# Patient Record
Sex: Female | Born: 1965
Health system: Southern US, Community
[De-identification: ages and names within clinical notes are randomized; demographics above are authoritative.]

## PROBLEM LIST (undated history)

## (undated) DIAGNOSIS — N6310 Unspecified lump in the right breast, unspecified quadrant: Secondary | ICD-10-CM

## (undated) HISTORY — PX: DILATION AND CURETTAGE OF UTERUS: SHX78

---

## 2010-07-30 ENCOUNTER — Inpatient Hospital Stay (INDEPENDENT_AMBULATORY_CARE_PROVIDER_SITE_OTHER)
Admission: RE | Admit: 2010-07-30 | Discharge: 2010-07-30 | Disposition: A | Discharge status: Home / Self Care | Payer: BC Managed Care – PPO | Source: Ambulatory Visit | Attending: Family Medicine | Admitting: Family Medicine

## 2010-07-30 ENCOUNTER — Encounter: Payer: Self-pay | Admitting: Family Medicine

## 2010-07-30 DIAGNOSIS — M719 Bursopathy, unspecified: Secondary | ICD-10-CM

## 2010-07-30 DIAGNOSIS — R209 Unspecified disturbances of skin sensation: Secondary | ICD-10-CM

## 2010-07-30 DIAGNOSIS — M7710 Lateral epicondylitis, unspecified elbow: Secondary | ICD-10-CM

## 2010-07-30 DIAGNOSIS — M67919 Unspecified disorder of synovium and tendon, unspecified shoulder: Secondary | ICD-10-CM

## 2010-08-04 ENCOUNTER — Telehealth (INDEPENDENT_AMBULATORY_CARE_PROVIDER_SITE_OTHER): Payer: Self-pay | Admitting: *Deleted

## 2011-01-11 NOTE — Telephone Encounter (Signed)
  Phone Note Outgoing Call Call back at [facility] Phone [phone]   Call placed by: Lajean Saver RN,  August 04, 2010 3:18 PM Call placed to: Patient Action Taken: Phone Call Completed Summary of Call: Callback: Patient reports tingling resolved in one foot. Lab results given. TOld to f/u with neurologist if numbness persists. F/u with sports med for pain in elbow if needed.      Appended Document:  Discussed normal lab results.  She states that she still has slight tingling in one foot. Recommended that if symptoms persist, will refer to neurologist.

## 2011-01-11 NOTE — Progress Notes (Signed)
Summary: BOTH FEET AND RT ARM TINGLING Room 3   Vital Signs:  Patient Profile:   45 Years Old Female CC:      Tingling, numbness in rt arm x 1 month/Numbness in feet x 1 week today pain Height:     67 inches Weight:      145 pounds O2 Sat:      100 % O2 treatment:    Room Air Temp:     98.7 degrees F oral Pulse rate:   68 / minute Pulse rhythm:   regular Resp:     12 per minute BP sitting:   120 / 74  (left arm) Cuff size:   regular  Pt. in pain?   yes    Location:   foot    Intensity:   3    Type:       stinging  Vitals Entered By: Emilio Math (July 30, 2010 4:05 PM)                   Current Allergies: No known allergies History of Present Illness Chief Complaint: Tingling, numbness in rt arm x 1 month/Numbness in feet x 1 week today pain History of Present Illness:  Subjective:  Patient presents with two complaints: 1)  For about a month she has had intermittent numbness in the right shoulder and right elbow. 2)  For about 10 days she has tingling in her left foot, and in the right foot tingling in toes only.  No loss of strength  REVIEW OF SYSTEMS Constitutional Symptoms      Denies fever, chills, night sweats, weight loss, weight gain, and fatigue.  Eyes       Denies change in vision, eye pain, eye discharge, glasses, contact lenses, and eye surgery. Ear/Nose/Throat/Mouth       Denies hearing loss/aids, change in hearing, ear pain, ear discharge, dizziness, frequent runny nose, frequent nose bleeds, sinus problems, sore throat, hoarseness, and tooth pain or bleeding.  Respiratory       Denies dry cough, productive cough, wheezing, shortness of breath, asthma, bronchitis, and emphysema/COPD.  Cardiovascular       Denies murmurs, chest pain, and tires easily with exhertion.    Gastrointestinal       Denies stomach pain, nausea/vomiting, diarrhea, constipation, blood in bowel movements, and indigestion. Genitourniary       Denies painful urination, kidney  stones, and loss of urinary control. Neurological       Complains of numbness and tingling.      Denies paralysis, seizures, and fainting/blackouts. Musculoskeletal       Denies muscle pain, joint pain, joint stiffness, decreased range of motion, redness, swelling, muscle weakness, and gout.  Skin       Denies bruising, unusual mles/lumps or sores, and hair/skin or nail changes.  Psych       Denies mood changes, temper/anger issues, anxiety/stress, speech problems, depression, and sleep problems.  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  Extraocular movement is intact.  Conjunctivae are not inflamed.  Mouth/pharynx:  Normal Neck:  Supple.  No adenopathy is present.  No thyromegaly is present  Lungs:  Clear to auscultation.  Breath sounds are  equal.  Heart:  Regular rate and rhythm without murmurs, rubs, or gallops.  Abdomen:  Nontender without masses or hepatosplenomegaly.  Bowel sounds are present.  No CVA or flank tenderness.  Extremities:  No edema.  Pedal pulses are full and equal.  Right shoulder:  Full range of motion.  Distinct tenderness over distal acromion.  No biceps tendon tenderness. Right elbow:  Full range of motion.  Distinct tenderness over lateral epicondyle, worse with resisted dorsiflexion of wrist. Neurologic:  Cranial nerves 2 through 12 are normal.  Patellar, achilles, and elbow reflexes are normal.  Cerebellar function is intact.  Gait and station are normal.   Sensation and strength in lower extremities normal. Assessment New Problems: LATERAL EPICONDYLITIS, RIGHT (ICD-726.32) BURSITIS, RIGHT SHOULDER (ICD-726.10) DISTURBANCE OF  SKIN SENSATION (ICD-782.0)   Plan New Medications/Changes: NAPROXEN 500 MG TABS (NAPROXEN) One by mouth two times a day pc  #20 x 1, 07/30/2010, Donna Christen MD  New Orders: T-CMP with estimated GFR [[address]] T-CBC w/Diff [40981-19147] T-Vitamin B12 [82956-21308] T-Folate [23340] New Patient Level IV [99204] Tennis Elbow Support [L3701] Planning Comments:   Elbow brace applied to right elbow.  Begin NSAID.  Begin stretching/strengthening exercises ([employer] information and instruction patient handout given)  Check CMP, CBC, B12/Folate levels.  Recommend daily multiple vitamin. If paresthesias persist, recommend follow-up by neurologist.     Followup with [facility] Medicine Clinic if shoulder and elbow not improved in two weeks.                                                                           The patient and/or caregiver has been counseled thoroughly with regard to medications prescribed including dosage, schedule, interactions, rationale for use, and possible side effects and they verbalize understanding.  Diagnoses and expected course of recovery discussed and will return if not improved as expected or if the condition worsens. Patient and/or caregiver verbalized understanding.  Prescriptions: NAPROXEN 500 MG TABS (NAPROXEN) One by mouth two times a day pc  #20 x 1   Entered and Authorized by:   Donna Christen MD   Signed by:   Donna Christen MD on 07/30/2010   Method used:   Print then Give to Patient   RxID:   [id number]   Orders Added: 1)  T-CMP with estimated GFR [[address]] 2)  T-CBC w/Diff [53664-40347] 3)  T-Vitamin B12 [82607-23330] 4)  T-Folate [23340] 5)  New Patient Level IV [99204] 6)  Tennis Elbow Support [L3701]

## 2014-03-19 ENCOUNTER — Encounter: Payer: Self-pay | Admitting: Family Medicine

## 2014-03-19 ENCOUNTER — Other Ambulatory Visit (HOSPITAL_COMMUNITY)
Admission: RE | Admit: 2014-03-19 | Discharge: 2014-03-19 | Disposition: A | Discharge status: Home / Self Care | Payer: 59 | Source: Ambulatory Visit | Attending: Family Medicine | Admitting: Family Medicine

## 2014-03-19 ENCOUNTER — Ambulatory Visit (INDEPENDENT_AMBULATORY_CARE_PROVIDER_SITE_OTHER): Payer: 59 | Admitting: Family Medicine

## 2014-03-19 VITALS — BP 107/68 | HR 77 | Ht 67.0 in | Wt 158.0 lb

## 2014-03-19 DIAGNOSIS — Z01419 Encounter for gynecological examination (general) (routine) without abnormal findings: Secondary | ICD-10-CM | POA: Diagnosis present

## 2014-03-19 DIAGNOSIS — Z1151 Encounter for screening for human papillomavirus (HPV): Secondary | ICD-10-CM | POA: Diagnosis present

## 2014-03-19 DIAGNOSIS — Z23 Encounter for immunization: Secondary | ICD-10-CM

## 2014-03-19 DIAGNOSIS — N951 Menopausal and female climacteric states: Secondary | ICD-10-CM

## 2014-03-19 DIAGNOSIS — Z0000 Encounter for general adult medical examination without abnormal findings: Secondary | ICD-10-CM

## 2014-03-19 DIAGNOSIS — Z1231 Encounter for screening mammogram for malignant neoplasm of breast: Secondary | ICD-10-CM

## 2014-03-19 NOTE — Progress Notes (Signed)
Subjective:    Patient ID: Cathy Price, female    DOB: Jun 08, 1965, 49 y.o.   MRN: [id number]  HPI    here to establish care today and for CPE today. She's a 49 year old female he does not take any prescription medications. She doesn't have any hard pressing concerns but does feel like she starting to go through change in life, perimenopause. Her mother was about 4849 or 5950 when she went through menopause. She has been having some hotflashes.  She does have a family history of lung cancer heart disease and diabetes. Periods have been more sporadic in the last year. Taking Black Cohash for the last year and does seem to help. Hs inc the soy in her diet.   Last Pap smear mammogram was 3 years ago. Last lab work was 5 years ago.  Last vaccine was when she was a kid.   Review of Systems  Constitutional: Negative for fever, diaphoresis and unexpected weight change.  HENT: Negative for hearing loss, rhinorrhea and tinnitus.   Eyes: Negative for visual disturbance.  Respiratory: Negative for cough and wheezing.   Cardiovascular: Negative for chest pain and palpitations.  Gastrointestinal: Negative for nausea, vomiting, diarrhea and blood in stool.  Genitourinary: Negative for vaginal bleeding, vaginal discharge and difficulty urinating.  Musculoskeletal: Negative for myalgias and arthralgias.  Skin: Negative for rash.  Neurological: Negative for headaches.  Hematological: Negative for adenopathy. Does not bruise/bleed easily.  Psychiatric/Behavioral: Negative for sleep disturbance and dysphoric mood. The patient is not nervous/anxious.    BP 107/68 mmHg  Pulse 77  Ht 5\' 7"  (1.702 m)  Wt 158 lb (71.668 kg)  BMI 24.74 kg/m2  SpO2 100%  LMP 01/27/2014    No Known Allergies  No past medical history on file.  No past surgical history on file.  History   Social History  . Marital Status: Married    Spouse Name: clifton     Number of Children: 2  . Years of Education: 12    Occupational History  . self employed     [employer]   Social History Main Topics  . Smoking status: Former Smoker    Quit date: 08/20/1987  . Smokeless tobacco: Not on file  . Alcohol Use: 3.6 oz/week    6 Not specified per week  . Drug Use: No  . Sexual Activity:    Partners: Male    Birth Control/ Protection: Condom   Other Topics Concern  . Not on file   Social History Narrative   2 coffee drinks per day. Works out 6 days per week for about an hour doing cardio and weights. Currently using condoms for birth control.    Family History  Problem Relation Age of Onset  . Cancer - Lung Paternal Grandfather     deceased  . Cancer - Lung Paternal Grandmother     deceased  . Diabetes Paternal Aunt     deceased  . Heart attack Maternal Grandfather     deceased    Outpatient Encounter Prescriptions as of 03/19/2014  Medication Sig  . BLACK COHOSH PO Take by mouth.  . Multiple Vitamin (MULTIVITAMIN) tablet Take 1 tablet by mouth daily.  . Omega-3 Fatty Acids (FISH OIL PO) Take by mouth.          Objective:   Physical Exam  Constitutional: She is oriented to person, place, and time. She appears well-developed and well-nourished.  HENT:  Head: Normocephalic and atraumatic.  Right Ear: External ear  normal.  Left Ear: External ear normal.  Nose: Nose normal.  Mouth/Throat: Oropharynx is clear and moist.  TMs and canals are clear.   Eyes: Conjunctivae and EOM are normal. Pupils are equal, round, and reactive to light.  Neck: Neck supple. No thyromegaly present.  Cardiovascular: Normal rate, regular rhythm and normal heart sounds.   Pulmonary/Chest: Effort normal and breath sounds normal. She has no wheezes.  Abdominal: Soft. Bowel sounds are normal. She exhibits no distension and no mass. There is no tenderness. There is no rebound and no guarding.  Genitourinary: Vagina normal and uterus normal. No breast swelling, tenderness, discharge or bleeding. There is  no rash or injury on the right labia. There is no rash or injury on the left labia. Cervix exhibits no motion tenderness, no discharge and no friability. Right adnexum displays no mass, no tenderness and no fullness. Left adnexum displays no mass, no tenderness and no fullness. No erythema, tenderness or bleeding in the vagina. No foreign body around the vagina. No signs of injury around the vagina. No vaginal discharge found.  Uterus shift to the right  Lymphadenopathy:    She has no cervical adenopathy.  Neurological: She is alert and oriented to person, place, and time.  Skin: Skin is warm and dry.  Psychiatric: She has a normal mood and affect.          Assessment & Plan:  CPE -  Keep up a regular exercise program and make sure you are eating a healthy diet Try to eat 4 servings of dairy a day, or if you are lactose intolerant take a calcium with vitamin D daily.  Your vaccines are up to date.  Order placed for mammogram. Pap smear performed. We'll call results once available. Tdap given today. Due for CMP and fasting lipid panel. Lab slip provided.  Perimenopausal-continue Black cohosh since it seems to be working well. Certainly if at some point her symptoms worsen we can certainly discuss other options.

## 2014-03-19 NOTE — Patient Instructions (Signed)
Keep up a regular exercise program and make sure you are eating a healthy diet Try to eat 4 servings of dairy a day, or if you are lactose intolerant take a calcium with vitamin D daily.  Your vaccines are up to date.   

## 2014-03-26 LAB — CYTOLOGY - PAP

## 2014-03-28 NOTE — Progress Notes (Signed)
Quick Note:  Call patient: Your Pap smear is normal. Repeat in 5 years. ______ 

## 2014-04-03 ENCOUNTER — Other Ambulatory Visit: Payer: Self-pay | Admitting: Family Medicine

## 2014-04-03 DIAGNOSIS — Z1231 Encounter for screening mammogram for malignant neoplasm of breast: Secondary | ICD-10-CM

## 2014-04-04 ENCOUNTER — Ambulatory Visit: Payer: 59

## 2014-04-04 ENCOUNTER — Ambulatory Visit (INDEPENDENT_AMBULATORY_CARE_PROVIDER_SITE_OTHER): Payer: 59

## 2014-04-04 DIAGNOSIS — Z1231 Encounter for screening mammogram for malignant neoplasm of breast: Secondary | ICD-10-CM

## 2015-06-23 ENCOUNTER — Emergency Department: Payer: BLUE CROSS/BLUE SHIELD

## 2015-06-23 ENCOUNTER — Encounter: Payer: Self-pay | Admitting: *Deleted

## 2015-06-23 ENCOUNTER — Emergency Department
Admission: EM | Admit: 2015-06-23 | Discharge: 2015-06-23 | Disposition: A | Discharge status: Home / Self Care | Payer: BLUE CROSS/BLUE SHIELD | Source: Home / Self Care | Attending: Family Medicine | Admitting: Family Medicine

## 2015-06-23 DIAGNOSIS — M79662 Pain in left lower leg: Secondary | ICD-10-CM | POA: Diagnosis not present

## 2015-06-23 DIAGNOSIS — M25562 Pain in left knee: Secondary | ICD-10-CM | POA: Diagnosis not present

## 2015-06-23 NOTE — ED Notes (Signed)
Pt c/o left knee pain without injury started 2 weeks ago. Pain improved and worse this AM. Previously taken IBF. No previous injury.

## 2015-06-23 NOTE — ED Provider Notes (Signed)
CSN: [id number]     Arrival date & time 06/23/15  1419 History   First MD Initiated Contact with Patient 06/23/15 1429     Chief Complaint  Patient presents with  . Knee Pain   (Consider location/radiation/quality/duration/timing/severity/associated sxs/prior Treatment) HPI The pt is a 50yo female presenting to [facility] with c/o Left knee pain that started about 2 weeks ago, denies known injury. Pt states she woke with pain in her calf that then traveled to the back of her knee.  Pain started to resolve but then came back this morning.  Pain is sharp and stabbing "like a bee stinging me."  Pain is 6/10 at this time. Ibuprofen taken this morning.  Denies changes in skin color to her knee or leg.  Denies hx of blood clots. She is not on birth control. No recent surgery or long travel.   History reviewed. No pertinent past medical history. History reviewed. No pertinent past surgical history. Family History  Problem Relation Age of Onset  . Cancer - Lung Paternal Grandfather     deceased  . Cancer - Lung Paternal Grandmother     deceased  . Diabetes Paternal Aunt     deceased  . Heart attack Maternal Grandfather     deceased   Social History  Substance Use Topics  . Smoking status: Former Smoker    Quit date: 08/20/1987  . Smokeless tobacco: None  . Alcohol Use: 3.6 oz/week    6 Standard drinks or equivalent per week   OB History    No data available     Review of Systems  Constitutional: Negative for fever and chills.  Musculoskeletal: Positive for myalgias and arthralgias. Negative for back pain, joint swelling and gait problem.       Left posterior knee  Skin: Negative for color change, pallor, rash and wound.  Neurological: Negative for weakness and numbness.    Allergies  Review of patient's allergies indicates no known allergies.  Home Medications   Prior to Admission medications   Medication Sig Start Date End Date Taking? Authorizing Provider  BLACK COHOSH PO Take  by mouth.    Historical Provider, MD  Multiple Vitamin (MULTIVITAMIN) tablet Take 1 tablet by mouth daily.    Historical Provider, MD  Omega-3 Fatty Acids (FISH OIL PO) Take by mouth.    Historical Provider, MD   Meds Ordered and Administered this Visit  Medications - No data to display  BP 129/76 mmHg  Pulse 69  Wt 148 lb (67.132 kg)  SpO2 100% No data found.   Physical Exam  Constitutional: She is oriented to person, place, and time. She appears well-developed and well-nourished.  HENT:  Head: Normocephalic and atraumatic.  Eyes: EOM are normal.  Neck: Normal range of motion.  Cardiovascular: Normal rate.   Pulses:      Dorsalis pedis pulses are 2+ on the left side.  Pulmonary/Chest: Effort normal.  Musculoskeletal: Normal range of motion. She exhibits edema and tenderness.  Left knee: mild edema to medial aspect. Mild crepitus on palpation of anterior knee. Tenderness to posterior aspect of knee. Full ROM Calf- soft, non-tender.  Neurological: She is alert and oriented to person, place, and time.  Skin: Skin is warm and dry. No rash noted. No erythema.  Psychiatric: She has a normal mood and affect. Her behavior is normal.  Nursing note and vitals reviewed.   ED Course  Procedures (including critical care time)  Labs Review Labs Reviewed - No data to display  Imaging Review [place] Venous Img Lower Unilateral Left  06/23/2015  CLINICAL DATA:  Two week history of pain, primarily in knee region EXAM: LEFT LOWER EXTREMITY VENOUS DUPLEX ULTRASOUND TECHNIQUE: Gray-scale sonography with graded compression, as well as color Doppler and duplex ultrasound were performed to evaluate the left lower extremity deep venous system from the level of the common femoral vein and including the common femoral, femoral, profunda femoral, popliteal and calf veins including the posterior tibial, peroneal and gastrocnemius veins when visible. The superficial great saphenous vein was also interrogated.  Spectral Doppler was utilized to evaluate flow at rest and with distal augmentation maneuvers in the common femoral, femoral and popliteal veins. COMPARISON:  None. FINDINGS: Contralateral Common Femoral Vein: Respiratory phasicity is normal and symmetric with the symptomatic side. No evidence of thrombus. Normal compressibility. Common Femoral Vein: No evidence of thrombus. Normal compressibility, respiratory phasicity and response to augmentation. Saphenofemoral Junction: No evidence of thrombus. Normal compressibility and flow on color Doppler imaging. Profunda Femoral Vein: No evidence of thrombus. Normal compressibility and flow on color Doppler imaging. Femoral Vein: No evidence of thrombus. Normal compressibility, respiratory phasicity and response to augmentation. Popliteal Vein: No evidence of thrombus. Normal compressibility, respiratory phasicity and response to augmentation. Calf Veins: No evidence of thrombus. Normal compressibility and flow on color Doppler imaging. Superficial Great Saphenous Vein: No evidence of thrombus. Normal compressibility and flow on color Doppler imaging. Venous Reflux:  None. Other Findings:  None. IMPRESSION: No evidence of left lower extremity deep venous thrombosis. Right common femoral vein also patent. Electronically Signed   By: Bretta BangWilliam  Woodruff III M.D.   On: 06/23/2015 15:32      MDM   1. Posterior left knee pain    Pt c/o Left posterior knee pain. No known injury. PMS in tact. No evidence of underlying infection.  U/S: no evidence of DVT. No mention of Baker's cyst.  Discussed imaging with pt. Pain could be due to mild sprain. May try OTC knee sleeve. Elevate, ice. Acetaminophen and ibuprofen for pain. F/u with Sports Medicine in 1-2 weeks if not improving, sooner if worsening. Patient verbalized understanding and agreement with treatment plan.     Junius FinnerErin O'Malley, PA-C 06/23/15 [phone]

## 2016-09-27 ENCOUNTER — Telehealth: Payer: Self-pay

## 2016-09-27 DIAGNOSIS — Z0000 Encounter for general adult medical examination without abnormal findings: Secondary | ICD-10-CM

## 2016-09-27 NOTE — Telephone Encounter (Signed)
PT is scheduled to see Dr. Linford Arnold on 9/4 would like to do blood work before the appt / needs a lab order in the system please

## 2016-09-28 NOTE — Addendum Note (Signed)
Addended by: Collie Siad on: 09/28/2016 11:47 AM   Modules accepted: Orders

## 2016-09-28 NOTE — Telephone Encounter (Signed)
Labs ordered  Pt advised

## 2016-10-01 DIAGNOSIS — Z0000 Encounter for general adult medical examination without abnormal findings: Secondary | ICD-10-CM | POA: Diagnosis not present

## 2016-10-01 LAB — CBC WITH DIFFERENTIAL/PLATELET
Basophils Absolute: 82 cells/uL (ref 0–200)
Basophils Relative: 2 %
EOS PCT: 3 %
Eosinophils Absolute: 123 cells/uL (ref 15–500)
HCT: 44.2 % (ref 35.0–45.0)
Hemoglobin: 14.4 g/dL (ref 11.7–15.5)
LYMPHS PCT: 52 %
Lymphs Abs: 2132 cells/uL (ref 850–3900)
MCH: 29.8 pg (ref 27.0–33.0)
MCHC: 32.6 g/dL (ref 32.0–36.0)
MCV: 91.3 fL (ref 80.0–100.0)
MONOS PCT: 7 %
MPV: 9.9 fL (ref 7.5–12.5)
Monocytes Absolute: 287 cells/uL (ref 200–950)
NEUTROS ABS: 1476 {cells}/uL — AB (ref 1500–7800)
Neutrophils Relative %: 36 %
Platelets: 228 10*3/uL (ref 140–400)
RBC: 4.84 MIL/uL (ref 3.80–5.10)
RDW: 12.7 % (ref 11.0–15.0)
WBC: 4.1 10*3/uL (ref 3.8–10.8)

## 2016-10-02 LAB — LIPID PANEL
Cholesterol: 193 mg/dL (ref <200)
HDL: 81 mg/dL (ref >50)
LDL CALC: 94 mg/dL (ref <100)
TRIGLYCERIDES: 89 mg/dL (ref <150)
Total CHOL/HDL Ratio: 2.4 Ratio (ref <5.0)
VLDL: 18 mg/dL (ref <30)

## 2016-10-02 LAB — COMPLETE METABOLIC PANEL WITH GFR
ALT: 9 U/L (ref 6–29)
AST: 11 U/L (ref 10–35)
Albumin: 4.6 g/dL (ref 3.6–5.1)
Alkaline Phosphatase: 56 U/L (ref 33–115)
BUN: 15 mg/dL (ref 7–25)
CHLORIDE: 106 mmol/L (ref 98–110)
CO2: 26 mmol/L (ref 20–32)
CREATININE: 0.75 mg/dL (ref 0.50–1.10)
Calcium: 9.6 mg/dL (ref 8.6–10.2)
GFR, Est Non African American: >89 mL/min (ref >=60)
GLUCOSE: 93 mg/dL (ref 65–99)
Potassium: 4.3 mmol/L (ref 3.5–5.3)
SODIUM: 142 mmol/L (ref 135–146)
TOTAL PROTEIN: 7 g/dL (ref 6.1–8.1)
Total Bilirubin: 0.5 mg/dL (ref 0.2–1.2)

## 2016-10-02 LAB — TSH: TSH: 2.67 m[IU]/L

## 2016-10-03 NOTE — Telephone Encounter (Signed)
All labs are normal. 

## 2016-10-12 ENCOUNTER — Ambulatory Visit (INDEPENDENT_AMBULATORY_CARE_PROVIDER_SITE_OTHER): Payer: BLUE CROSS/BLUE SHIELD | Admitting: Family Medicine

## 2016-10-12 ENCOUNTER — Encounter: Payer: Self-pay | Admitting: Family Medicine

## 2016-10-12 VITALS — BP 120/56 | HR 66 | Wt 171.0 lb

## 2016-10-12 DIAGNOSIS — Z0000 Encounter for general adult medical examination without abnormal findings: Secondary | ICD-10-CM | POA: Diagnosis not present

## 2016-10-12 DIAGNOSIS — N912 Amenorrhea, unspecified: Secondary | ICD-10-CM

## 2016-10-12 NOTE — Progress Notes (Signed)
Subjective:     [name] is a 51 y.o. female and is here for a comprehensive physical exam. The patient reports no problems.  Social History   Social History  . Marital status: Married    Spouse name: clifton   . Number of children: 2  . Years of education: 12   Occupational History  . self employed     [employer]   Social History Main Topics  . Smoking status: Former Smoker    Quit date: 08/20/1987  . Smokeless tobacco: Never Used  . Alcohol use 3.6 oz/week    6 Standard drinks or equivalent per week  . Drug use: No  . Sexual activity: Yes    Partners: Male    Birth control/ protection: Condom   Other Topics Concern  . Not on file   Social History Narrative   2 coffee drinks per day. Works out 6 days per week for about an hour doing cardio and weights. Currently using condoms for birth control.   Health Maintenance  Topic Date Due  . HIV Screening  05/27/1982  . INFLUENZA VACCINE  10/12/2017 (Originally 09/08/2016)  . PAP SMEAR  03/19/2017  . TETANUS/TDAP  03/19/2024    The following portions of the patient's history were reviewed and updated as appropriate: allergies, current medications, past family history, past medical history, past social history, past surgical history and problem list.  Review of Systems A comprehensive review of systems was negative.   Objective:    BP (!) 120/56   Pulse 66   Wt 171 lb (77.6 kg)   SpO2 100%   BMI 26.78 kg/m  General appearance: alert, cooperative and appears stated age Head: Normocephalic, without obvious abnormality, atraumatic Eyes: conj clear, EOMI, PEERLA Ears: normal TM's and external ear canals both ears Nose: Nares normal. Septum midline. Mucosa normal. No drainage or sinus tenderness. Throat: lips, mucosa, and tongue normal; teeth and gums normal Neck: no adenopathy, no carotid bruit, no JVD, supple, symmetrical, trachea midline and thyroid not enlarged, symmetric, no tenderness/mass/nodules Back:  symmetric, no curvature. ROM normal. No CVA tenderness. Lungs: clear to auscultation bilaterally Breasts: normal appearance, no masses or tenderness Heart: regular rate and rhythm, S1, S2 normal, no murmur, click, rub or gallop Abdomen: soft, non-tender; bowel sounds normal; no masses,  no organomegaly Extremities: extremities normal, atraumatic, no cyanosis or edema Pulses: 2+ and symmetric Skin: Skin color, texture, turgor normal. No rashes or lesions Lymph nodes: Cervical, supraclavicular, and axillary nodes normal. Neurologic: Alert and oriented X 3, normal strength and tone. Normal symmetric reflexes. Normal coordination and gait    Assessment:    Healthy female exam.      Plan:     See After Visit Summary for Counseling Recommendations   Keep up a regular exercise program and make sure you are eating a healthy diet Try to eat 4 servings of dairy a day, or if you are lactose intolerant take a calcium with vitamin D daily.  Your vaccines are up to date.

## 2016-10-12 NOTE — Patient Instructions (Addendum)

## 2016-10-13 LAB — FOLLICLE STIMULATING HORMONE: FSH: 81.5 m[IU]/mL

## 2016-10-13 LAB — ESTRADIOL: Estradiol: 29 pg/mL

## 2016-10-13 LAB — PROGESTERONE: Progesterone: 0.5 ng/mL

## 2016-10-13 LAB — LUTEINIZING HORMONE: LH: 35 m[IU]/mL

## 2016-11-12 DIAGNOSIS — Z23 Encounter for immunization: Secondary | ICD-10-CM | POA: Diagnosis not present

## 2016-11-15 ENCOUNTER — Encounter: Payer: Self-pay | Admitting: *Deleted

## 2016-11-15 ENCOUNTER — Emergency Department
Admission: EM | Admit: 2016-11-15 | Discharge: 2016-11-15 | Disposition: A | Discharge status: Home / Self Care | Payer: BLUE CROSS/BLUE SHIELD | Source: Home / Self Care | Attending: Family Medicine | Admitting: Family Medicine

## 2016-11-15 DIAGNOSIS — S99922A Unspecified injury of left foot, initial encounter: Secondary | ICD-10-CM

## 2016-11-15 MED ORDER — DOXYCYCLINE HYCLATE 100 MG PO CAPS: 100.0000 mg | ORAL_CAPSULE | Freq: Two times a day (BID) | ORAL | 0 refills | Status: DC

## 2016-11-15 NOTE — ED Triage Notes (Signed)
Pt reports scarping her LT great toe while walking her dog 5 days ago. No OTC meds.

## 2016-11-15 NOTE — Discharge Instructions (Signed)
°  You may continue to use over the counter antibiotic ointment but start taking the oral antibiotic, Doxycycline, prescribed today.  You may soak your foot in warm water and epson salt 5-10 minutes a day. Pat dry area and re-bandage to help keep clean and protected.  If not improving in 4-5 days, or if symptoms worsening, please be re-evaluated.

## 2016-11-15 NOTE — ED Provider Notes (Signed)
[facility]    CSN: [id number] Arrival date & time: 11/15/16  1506     History   Chief Complaint Chief Complaint  Patient presents with  . Toe Injury    HPI Cathy Price is a 51 y.o. female.   HPI Cathy Price is a 51 y.o. female presenting to UC with c/o 5 days of Left great toe pain after accidentally stubbing it on her driveway while walking her dogs while barefoot.  Pain is 2/10 but her husband was becoming concerned.  Pain has not worsened for pt.  She has been keeping clean with soap and water and applying Neosporin.     History reviewed. No pertinent past medical history.  Patient Active Problem List   Diagnosis Date Noted  . Perimenopausal 03/19/2014  . DISTURBANCE OF SKIN SENSATION 07/30/2010    History reviewed. No pertinent surgical history.  OB History    No data available       Home Medications    Prior to Admission medications   Medication Sig Start Date End Date Taking? Authorizing Provider  BLACK COHOSH PO Take by mouth.    [provider]  doxycycline (VIBRAMYCIN) 100 MG capsule Take 1 capsule (100 mg total) by mouth 2 (two) times daily. One po bid x 7 days 11/15/16   Lurene Shadow, PA-C  Multiple Vitamin (MULTIVITAMIN) tablet Take 1 tablet by mouth daily.    [provider]  Omega-3 Fatty Acids (FISH OIL PO) Take by mouth.    [provider]    Family History Family History  Problem Relation Age of Onset  . Cancer - Lung Paternal Grandfather        deceased  . Cancer - Lung Paternal Grandmother        deceased  . Diabetes Paternal Aunt        deceased  . Heart attack Maternal Grandfather        deceased    Social History Social History  Substance Use Topics  . Smoking status: Former Smoker    Quit date: 08/20/1987  . Smokeless tobacco: Never Used  . Alcohol use 3.6 oz/week    6 Standard drinks or equivalent per week     Allergies   Patient has no known allergies.   Review of  Systems Review of Systems  Musculoskeletal: Positive for joint swelling. Negative for arthralgias and myalgias.  Skin: Positive for color change and wound.     Physical Exam Triage Vital Signs ED Triage Vitals  Enc Vitals Group     BP 11/15/16 1530 135/81     Pulse Rate 11/15/16 1530 81     Resp 11/15/16 1530 16     Temp 11/15/16 1530 98.3 F (36.8 C)     Temp Source 11/15/16 1530 Oral     SpO2 11/15/16 1530 98 %     Weight 11/15/16 1531 170 lb (77.1 kg)     Height 11/15/16 1531  (1.702 m)     Head Circumference --      Peak Flow --      Pain Score 11/15/16 1531 2     Pain Loc --      Pain Edu? --      Excl. in GC? --    No data found.   Updated Vital Signs BP 135/81 (BP Location: Left Arm)   Pulse 81   Temp 98.3 F (36.8 C) (Oral)   Resp 16   Ht  (1.702 m)  Wt 170 lb (77.1 kg)   LMP 01/27/2014   SpO2 98%   BMI 26.63 kg/m   Visual Acuity Right Eye Distance:   Left Eye Distance:   Bilateral Distance:    Right Eye Near:   Left Eye Near:    Bilateral Near:     Physical Exam  Constitutional: She is oriented to person, place, and time. She appears well-developed and well-nourished.  HENT:  Head: Normocephalic and atraumatic.  Eyes: EOM are normal.  Neck: Normal range of motion.  Cardiovascular: Normal rate.   Pulmonary/Chest: Effort normal.  Musculoskeletal: Normal range of motion. She exhibits edema and tenderness.  Left great toe: full ROM. Minimal edema and tenderness (see skin exam).  Neurological: She is alert and oriented to person, place, and time.  Skin: Skin is warm and dry. Abrasion noted. There is erythema.  Left great toe: abrasion to distal aspect of toe. Small amount of white dead skin overlying abrasion.  No skin flaps present. No foreign bodies seen or palpated. Mild edema and erythema around nailbed.   Psychiatric: She has a normal mood and affect. Her behavior is normal.  Nursing note and vitals reviewed.    UC Treatments /  Results  Labs (all labs ordered are listed, but only abnormal results are displayed) Labs Reviewed - No data to display  EKG  EKG Interpretation None       Radiology No results found.  Procedures Procedures (including critical care time)  Medications Ordered in UC Medications - No data to display   Initial Impression / Assessment and Plan / UC Course  I have reviewed the triage vital signs and the nursing notes.  Pertinent labs & imaging results that were available during my care of the patient were reviewed by me and considered in my medical decision making (see chart for details).     Abrasion to Left great toe. Doubt fracture at this time, however, due to location of wound, will cover for early secondary infection with Doxycycline. F/u with PCP in 5-7 days if needed, sooner if worsening.  Home care instructions provided.   Final Clinical Impressions(s) / UC Diagnoses   Final diagnoses:  Injury of left great toe, initial encounter    New Prescriptions Discharge Medication List as of 11/15/2016  3:42 PM    START taking these medications   Details  doxycycline (VIBRAMYCIN) 100 MG capsule Take 1 capsule (100 mg total) by mouth 2 (two) times daily. One po bid x 7 days, Starting Mon 11/15/2016, Normal         Controlled Substance Prescriptions [place] Controlled Substance Registry consulted? Not Applicable   Rolla Plate 11/15/16 1650

## 2017-10-03 ENCOUNTER — Encounter: Payer: Self-pay | Admitting: Emergency Medicine

## 2017-10-03 ENCOUNTER — Emergency Department
Admission: EM | Admit: 2017-10-03 | Discharge: 2017-10-03 | Disposition: A | Discharge status: Home / Self Care | Payer: BLUE CROSS/BLUE SHIELD | Source: Home / Self Care | Attending: Family Medicine | Admitting: Family Medicine

## 2017-10-03 DIAGNOSIS — H6591 Unspecified nonsuppurative otitis media, right ear: Secondary | ICD-10-CM | POA: Diagnosis not present

## 2017-10-03 DIAGNOSIS — H6121 Impacted cerumen, right ear: Secondary | ICD-10-CM | POA: Diagnosis not present

## 2017-10-03 MED ORDER — AMOXICILLIN 875 MG PO TABS: 875.0000 mg | ORAL_TABLET | Freq: Two times a day (BID) | ORAL | 0 refills | Status: DC

## 2017-10-03 NOTE — ED Provider Notes (Signed)
[facility]    CSN: [id number] Arrival date & time: 10/03/17  4098     History   Chief Complaint Chief Complaint  Patient presents with  . Cerumen Impaction    HPI Cathy Price is a 52 y.o. female.   Patient reports that her right ear recently began to feel clogged.  She developed right earache 3 days ago.  No sinus congestion or cold symptoms.  No fevers, chills, and sweats.  The history is provided by the patient.    History reviewed. No pertinent past medical history.  Patient Active Problem List   Diagnosis Date Noted  . Perimenopausal 03/19/2014  . DISTURBANCE OF SKIN SENSATION 07/30/2010    History reviewed. No pertinent surgical history.  OB History   None      Home Medications    Prior to Admission medications   Medication Sig Start Date End Date Taking? Authorizing Provider  amoxicillin (AMOXIL) 875 MG tablet Take 1 tablet (875 mg total) by mouth 2 (two) times daily. 10/03/17   Lattie Haw, MD  BLACK COHOSH PO Take by mouth.    [provider]  doxycycline (VIBRAMYCIN) 100 MG capsule Take 1 capsule (100 mg total) by mouth 2 (two) times daily. One po bid x 7 days 11/15/16   Lurene Shadow, PA-C  Multiple Vitamin (MULTIVITAMIN) tablet Take 1 tablet by mouth daily.    [provider]  Omega-3 Fatty Acids (FISH OIL PO) Take by mouth.    [provider]    Family History Family History  Problem Relation Age of Onset  . Cancer - Lung Paternal Grandfather        deceased  . Cancer - Lung Paternal Grandmother        deceased  . Diabetes Paternal Aunt        deceased  . Heart attack Maternal Grandfather        deceased    Social History Social History   Tobacco Use  . Smoking status: Former Smoker    Last attempt to quit: 08/20/1987    Years since quitting: 30.1  . Smokeless tobacco: Never Used  Substance Use Topics  . Alcohol use: Yes    Alcohol/week: 6.0 standard drinks    Types: 6 Standard drinks  or equivalent per week  . Drug use: No     Allergies   Patient has no known allergies.   Review of Systems Review of Systems No sore throat No cough No pleuritic pain No wheezing No nasal congestion No post-nasal drainage No sinus pain/pressure No itchy/red eyes + right earache No hemoptysis No SOB No fever/chills No nausea No vomiting No abdominal pain No diarrhea No urinary symptoms No skin rash No fatigue No myalgias No headache    Physical Exam Triage Vital Signs ED Triage Vitals  Enc Vitals Group     BP 10/03/17 1035 111/74     Pulse Rate 10/03/17 1035 74     Resp --      Temp 10/03/17 1035 98.3 F (36.8 C)     Temp Source 10/03/17 1035 Oral     SpO2 10/03/17 1035 98 %     Weight 10/03/17 1036 175 lb (79.4 kg)     Height --      Head Circumference --      Peak Flow --      Pain Score 10/03/17 1035 2     Pain Loc --      Pain Edu? --  Excl. in GC? --    No data found.  Updated Vital Signs BP 111/74 (BP Location: Right Arm)   Pulse 74   Temp 98.3 F (36.8 C) (Oral)   Wt 79.4 kg   LMP 01/27/2014   SpO2 98%   BMI 27.41 kg/m   Visual Acuity Right Eye Distance:   Left Eye Distance:   Bilateral Distance:    Right Eye Near:   Left Eye Near:    Bilateral Near:     Physical Exam Nursing notes and Vital Signs reviewed. Appearance:  Patient appears stated age, and in no acute distress Eyes:  Pupils are equal, round, and reactive to light and accomodation.  Extraocular movement is intact.  Conjunctivae are not inflamed  Ears:   Left canal and tympanic membrane normal.  Right canal occluded by cerumen.  Post lavage, right canal normal and right tympanic membrane erythematous/buling. Nose:   Normal turbinates.  No sinus tenderness.   Pharynx:  Normal Neck:  Supple,  No adenopathy Lungs:  Normal respiration Heart:  Normal rage. Skin:  No rash present.    UC Treatments / Results  Labs (all labs ordered are listed, but only abnormal  results are displayed) Labs Reviewed - No data to display  EKG None  Radiology No results found.  Procedures Procedures  Right ear lavage by nurse.  Medications Ordered in UC Medications - No data to display  Initial Impression / Assessment and Plan / UC Course  I have reviewed the triage vital signs and the nursing notes.  Pertinent labs & imaging results that were available during my care of the patient were reviewed by me and considered in my medical decision making (see chart for details).    Begin amoxicillin. Followup with Family Doctor if not improved in 10 days.   Final Clinical Impressions(s) / UC Diagnoses   Final diagnoses:  Impacted cerumen of right ear  Right otitis media with effusion     Discharge Instructions     May add Pseudoephedrine (30mg , one or two every 4 to 6 hours) for sinus congestion.     ED Prescriptions    Medication Sig Dispense Auth. Provider   amoxicillin (AMOXIL) 875 MG tablet Take 1 tablet (875 mg total) by mouth 2 (two) times daily. 20 tablet Lattie HawBeese, Fannie Alomar A, MD         Lattie HawBeese, Ayan Heffington A, MD 10/03/17 1131

## 2017-10-03 NOTE — ED Triage Notes (Signed)
Pt c/o right ear pain since Friday.

## 2017-10-03 NOTE — Discharge Instructions (Addendum)
May add Pseudoephedrine (30mg, one or two every 4 to 6 hours) for sinus congestion.    

## 2017-10-07 ENCOUNTER — Ambulatory Visit (INDEPENDENT_AMBULATORY_CARE_PROVIDER_SITE_OTHER): Payer: BLUE CROSS/BLUE SHIELD | Admitting: Family Medicine

## 2017-10-07 ENCOUNTER — Encounter: Payer: Self-pay | Admitting: Family Medicine

## 2017-10-07 VITALS — BP 105/70 | HR 66 | Temp 98.2°F | Ht 67.0 in | Wt 176.0 lb

## 2017-10-07 DIAGNOSIS — H9201 Otalgia, right ear: Secondary | ICD-10-CM

## 2017-10-07 DIAGNOSIS — H60331 Swimmer's ear, right ear: Secondary | ICD-10-CM | POA: Diagnosis not present

## 2017-10-07 DIAGNOSIS — H938X1 Other specified disorders of right ear: Secondary | ICD-10-CM | POA: Diagnosis not present

## 2017-10-07 MED ORDER — OFLOXACIN 0.3 % OT SOLN: 5.0000 [drp] | Freq: Every day | OTIC | 0 refills | Status: AC

## 2017-10-07 NOTE — Progress Notes (Signed)
   Subjective:    Patient ID: Cathy Price, female    DOB: 05/01/1965, 52 y.o.   MRN: [id number]  HPI 52 year old female is here today for follow-up for persistent right ear pain.  She actually went to urgent care on August 26 approximately 4 days ago for several days of right ear fullness and pain.  She was diagnosed with otitis with effusion after ear irrigation and was started on amoxicillin.  She is still having ear fullness and significant discomfort and pain.  This morning she noticed some bright yellow discharge so decided to come in.  She has not had a fever or chills or sweats.  He has been taking a decongestant but has not been using any ibuprofen or anti-inflammatories.   Review of Systems     Objective:   Physical Exam  Constitutional: She is oriented to person, place, and time. She appears well-developed and well-nourished.  HENT:  Head: Normocephalic and atraumatic.  Right Ear: External ear normal.  Left Ear: External ear normal.  Nose: Nose normal.  Right canal is about half full of white-colored debris.  Barely able to visualize the tympanic membrane but no significant erythema.  Eyes: Pupils are equal, round, and reactive to light. Conjunctivae and EOM are normal.  Neck: Neck supple. No thyromegaly present.  Cardiovascular: Normal rate.  Pulmonary/Chest: Effort normal. She has no wheezes.  Lymphadenopathy:    She has no cervical adenopathy.  Neurological: She is alert and oriented to person, place, and time.  Skin: Skin is warm and dry.  Psychiatric: She has a normal mood and affect.          Assessment & Plan:  Otitis externa-she does have white-colored debris consistent with a swimmer's ear.  We will add ofloxacin eardrops and encouraged her to continue with her oral amoxicillin since I really cannot get a great view of the tympanic membrane because of the debris.  If she is not better after the weekend then encouraged her to call the office back and I will  refer her to ENT as I explained to her that this could actually be a fungal infection.  K to add ibuprofen or Tylenol for fever pain relief.

## 2017-10-13 ENCOUNTER — Encounter: Payer: Self-pay | Admitting: Osteopathic Medicine

## 2017-10-13 ENCOUNTER — Ambulatory Visit (INDEPENDENT_AMBULATORY_CARE_PROVIDER_SITE_OTHER): Payer: BLUE CROSS/BLUE SHIELD | Admitting: Osteopathic Medicine

## 2017-10-13 VITALS — BP 116/63 | HR 69 | Temp 98.1°F | Wt 178.7 lb

## 2017-10-13 DIAGNOSIS — R109 Unspecified abdominal pain: Secondary | ICD-10-CM

## 2017-10-13 DIAGNOSIS — H9201 Otalgia, right ear: Secondary | ICD-10-CM | POA: Diagnosis not present

## 2017-10-13 DIAGNOSIS — R21 Rash and other nonspecific skin eruption: Secondary | ICD-10-CM | POA: Diagnosis not present

## 2017-10-13 LAB — POCT URINALYSIS DIPSTICK
Bilirubin, UA: NEGATIVE
Glucose, UA: NEGATIVE
Ketones, UA: NEGATIVE
NITRITE UA: NEGATIVE
PH UA: 5.5 (ref 5.0–8.0)
PROTEIN UA: NEGATIVE
Spec Grav, UA: >=1.03 — AB (ref 1.010–1.025)
UROBILINOGEN UA: 0.2 U/dL

## 2017-10-13 MED ORDER — FLUCONAZOLE 150 MG PO TABS: 150.0000 mg | ORAL_TABLET | Freq: Once | ORAL | 1 refills | Status: DC

## 2017-10-13 MED ORDER — IPRATROPIUM BROMIDE 0.06 % NA SOLN: 2.0000 | Freq: Four times a day (QID) | NASAL | 1 refills | Status: DC

## 2017-10-13 NOTE — Patient Instructions (Addendum)
   Will keep an eye on the rash, if this spreads a bit we can probably bet on a drug reaction to amoxicillin.   For ear, let's get you to an ENT and try nasal spray to help drain things out.   For urine, will treat as yeast infection w/ Diflucan. I usually put a refill on these in case a yeast infection bothers you in the future. Will await culture results - no news is good news, if we need to start medicines over the weekend you'll get a call. You call us if symptoms worse (urine frequency, pain with urination)

## 2017-10-13 NOTE — Progress Notes (Signed)
HPI: Cathy Price is a 52 y.o. female who  has no past medical history on file.  she presents to [facility] today, 10/13/17,  for chief complaint of:  Rash Ear pain Flank pain   Recent vacation in [place] 2 weeks ago, no fever/chills or systemic illness.    Rash on arms, torso, between thighs, buttocks. Redness, no itching. Noticed yesterday.   Ear feels "muffled," doesn't think the antibiotic worked.  Records reviewed, 10/03/2017 treated for impacted cerumen of right ear, right otitis media with effusion.  Amoxicillin 875 mg twice daily for 10 days was prescribed.  Followed up with PCP 10/07/2017 persistent pain and yellow discharge, for otitis externa/swimmer's ear with ofloxacin drops, continue oral amoxicillin.  Advised to call if no better and would place referral to ENT.   Concerned about yeast infection because of R flank pain for a few days now. UA as below. No dysuria or frequency. No fever or abdominal pain.          Past medical history, surgical history, and family history reviewed.  Current medication list and allergy/intolerance information reviewed.   (See remainder of HPI, ROS, Phys Exam below)  No results found.  Results for orders placed or performed in visit on 10/13/17 (from the past 72 hour(s))  POCT Urinalysis Dipstick     Status: Abnormal   Collection Time: 10/13/17  3:49 PM  Result Value Ref Range   Color, UA YELLOW    Clarity, UA CLEAR    Glucose, UA Negative Negative   Bilirubin, UA NEGATIVE    Ketones, UA NEGATIVE    Spec Grav, UA >=1.030 (A) 1.010 - 1.025   Blood, UA TRACE-LYSED    pH, UA 5.5 5.0 - 8.0   Protein, UA Negative Negative   Urobilinogen, UA 0.2 0.2 or 1.0 E.U./dL   Nitrite, UA NEGATIVE    Leukocytes, UA Small (1+) (A) Negative   Appearance     Odor       ASSESSMENT/PLAN:   Right flank pain - Plan: POCT Urinalysis Dipstick, Urine Culture, Urinalysis, microscopic  only  Rash  Right ear pain - Plan: Ambulatory referral to ENT   Meds ordered this encounter  Medications  . ipratropium (ATROVENT) 0.06 % nasal spray    Sig: Place 2 sprays into both nostrils 4 (four) times daily.    Dispense:  15 mL    Refill:  1  . fluconazole (DIFLUCAN) 150 MG tablet    Sig: Take 1 tablet (150 mg total) by mouth once for 1 dose. Can repeat dose 48-72 hours for persistent yeast symptoms    Dispense:  2 tablet    Refill:  1    Patient Instructions   Will keep an eye on the rash, if this spreads a bit we can probably bet on a drug reaction to amoxicillin.   For ear, let's get you to an ENT and try nasal spray to help drain things out.   For urine, will treat as yeast infection w/ Diflucan. I usually put a refill on these in case a yeast infection bothers you in the future. Will await culture results - no news is good news, if we need to start medicines over the weekend you'll get a call. You call us if symptoms worse (urine frequency, pain with urination)    Follow-up plan: Return if symptoms worsen or fail to improve.                  ############################################ ############################################ ############################################ ############################################  Outpatient Encounter Medications as of 10/13/2017  Medication Sig  . amoxicillin (AMOXIL) 875 MG tablet Take 1 tablet (875 mg total) by mouth 2 (two) times daily.  . Multiple Vitamin (MULTIVITAMIN) tablet Take 1 tablet by mouth daily.  Marland Kitchen ofloxacin (FLOXIN) 0.3 % OTIC solution Place 5 drops into the right ear daily for 7 days.  . Omega-3 Fatty Acids (FISH OIL PO) Take by mouth.   No facility-administered encounter medications on file as of 10/13/2017.    No Known Allergies    Review of Systems:  Constitutional: No recent illness  HEENT: No  headache, no vision change  Cardiac: No  chest pain, No  pressure, No  palpitations  Respiratory:  No  shortness of breath. No  Cough  Gastrointestinal: No  abdominal pain  Musculoskeletal: No new myalgia/arthralgia  Skin: +Rash  Hem/Onc: No  easy bruising/bleeding  Psychiatric: No  concerns with depression, No  concerns with anxiety  Exam:  BP 116/63 (BP Location: Left Arm, Patient Position: Sitting, Cuff Size: Normal)   Pulse 69   Temp 98.1 F (36.7 C) (Oral)   Wt 178 lb 11.2 oz (81.1 kg)   LMP 01/27/2014   BMI 27.99 kg/m   Constitutional: VS see above. General Appearance: alert, well-developed, well-nourished, NAD  Eyes: Normal lids and conjunctive, non-icteric sclera  Ears, Nose, Mouth, Throat: MMM, Normal external inspection ears/nares/mouth/lips/gums.  Neck: No masses, trachea midline.   Respiratory: Normal respiratory effort.   Musculoskeletal: Gait normal. Symmetric and independent movement of all extremities  Neurological: Normal balance/coordination. No tremor.  Skin: warm, dry, intact.   Psychiatric: Normal judgment/insight. Normal mood and affect. Oriented x3.   Visit summary with medication list and pertinent instructions was printed for patient to review, advised to alert [place] if any changes needed. All questions at time of visit were answered - patient instructed to contact office with any additional concerns. ER/RTC precautions were reviewed with the patient and understanding verbalized.   Follow-up plan: Return if symptoms worsen or fail to improve.  Note: Total time spent 25 minutes, greater than 50% of the visit was spent face-to-face counseling and coordinating care for the following: The primary encounter diagnosis was Right flank pain. Diagnoses of Rash and Right ear pain were also pertinent to this visit.Marland Kitchen  Please note: voice recognition software was used to produce this document, and typos may escape review. Please contact Dr. Lyn Hollingshead for any needed clarifications.

## 2017-10-14 LAB — URINALYSIS, MICROSCOPIC ONLY
BACTERIA UA: NONE SEEN /HPF
Hyaline Cast: NONE SEEN /LPF
RBC / HPF: NONE SEEN /HPF (ref 0–2)

## 2017-10-14 LAB — URINE CULTURE
MICRO NUMBER: 91064095
RESULT: NO GROWTH
SPECIMEN QUALITY: ADEQUATE

## 2017-10-18 ENCOUNTER — Other Ambulatory Visit: Payer: Self-pay | Admitting: Osteopathic Medicine

## 2018-04-11 ENCOUNTER — Encounter: Payer: Self-pay | Admitting: Family Medicine

## 2018-04-11 ENCOUNTER — Ambulatory Visit (INDEPENDENT_AMBULATORY_CARE_PROVIDER_SITE_OTHER): Payer: PRIVATE HEALTH INSURANCE | Admitting: Family Medicine

## 2018-04-11 VITALS — BP 115/49 | HR 88 | Ht 67.0 in | Wt 175.0 lb

## 2018-04-11 DIAGNOSIS — Z1231 Encounter for screening mammogram for malignant neoplasm of breast: Secondary | ICD-10-CM

## 2018-04-11 DIAGNOSIS — Z1211 Encounter for screening for malignant neoplasm of colon: Secondary | ICD-10-CM | POA: Diagnosis not present

## 2018-04-11 DIAGNOSIS — Z0000 Encounter for general adult medical examination without abnormal findings: Secondary | ICD-10-CM

## 2018-04-11 NOTE — Progress Notes (Signed)
Subjective:     Cathy Price is a 53 y.o. female and is here for a comprehensive physical exam. The patient reports no problems.  She is doing well overall.  She is been working out 5 to 6 days/week.  She is overdue for her mammogram last one was in 2016 but she denies any breast problems.  She denies any pelvic pain or abnormal discharge.  Social History   Socioeconomic History  . Marital status: Married    Spouse name: clifton   . Number of children: 2  . Years of education: 42  . Highest education level: Not on file  Occupational History  . Occupation: self employed    Comment: Marine scientist  Social Needs  . Financial resource strain: Not on file  . Food insecurity:    Worry: Not on file    Inability: Not on file  . Transportation needs:    Medical: Not on file    Non-medical: Not on file  Tobacco Use  . Smoking status: Former Smoker    Last attempt to quit: 08/20/1987    Years since quitting: 30.6  . Smokeless tobacco: Never Used  Substance and Sexual Activity  . Alcohol use: Yes    Alcohol/week: 6.0 standard drinks    Types: 6 Standard drinks or equivalent per week  . Drug use: No  . Sexual activity: Yes    Partners: Male    Birth control/protection: Condom  Lifestyle  . Physical activity:    Days per week: Not on file    Minutes per session: Not on file  . Stress: Not on file  Relationships  . Social connections:    Talks on phone: Not on file    Gets together: Not on file    Attends religious service: Not on file    Active member of club or organization: Not on file    Attends meetings of clubs or organizations: Not on file    Relationship status: Not on file  . Intimate partner violence:    Fear of current or ex partner: Not on file    Emotionally abused: Not on file    Physically abused: Not on file    Forced sexual activity: Not on file  Other Topics Concern  . Not on file  Social History Narrative   2 coffee drinks per day. Works out 6 days per  week for about an hour doing cardio and weights. Currently using condoms for birth control.   Health Maintenance  Topic Date Due  . HIV Screening  05/27/1982  . MAMMOGRAM  05/26/2017  . COLONOSCOPY  05/26/2017  . PAP SMEAR-Modifier  03/20/2019  . TETANUS/TDAP  03/19/2024  . INFLUENZA VACCINE  Completed    The following portions of the patient's history were reviewed and updated as appropriate: allergies, current medications, past family history, past medical history, past social history, past surgical history and problem list.  Review of Systems A comprehensive review of systems was negative.   Objective:    BP (!) 115/49   Pulse 88   Ht 5\' 7"  (1.702 m)   Wt 175 lb (79.4 kg)   LMP 01/27/2014   SpO2 100%   BMI 27.41 kg/m  General appearance: alert, cooperative and appears stated age Head: Normocephalic, without obvious abnormality, atraumatic Eyes: conj clear, EOMI, PEERLA Ears: normal TM's and external ear canals both ears Nose: Nares normal. Septum midline. Mucosa normal. No drainage or sinus tenderness. Throat: lips, mucosa, and tongue normal; teeth and gums  normal Neck: no adenopathy, no carotid bruit, no JVD, supple, symmetrical, trachea midline and thyroid not enlarged, symmetric, no tenderness/mass/nodules Back: symmetric, no curvature. ROM normal. No CVA tenderness. Lungs: clear to auscultation bilaterally Breasts: normal appearance, no masses or tenderness Heart: regular rate and rhythm, S1, S2 normal, no murmur, click, rub or gallop Abdomen: soft, non-tender; bowel sounds normal; no masses,  no organomegaly Extremities: extremities normal, atraumatic, no cyanosis or edema Pulses: 2+ and symmetric Skin: Skin color, texture, turgor normal. No rashes or lesions Lymph nodes: Cervical, supraclavicular, and axillary nodes normal. Neurologic: Alert and oriented X 3, normal strength and tone. Normal symmetric reflexes. Normal coordination and gait    Assessment:     Healthy female exam.     Plan:     See After Visit Summary for Counseling Recommendations   Keep up a regular exercise program and make sure you are eating a healthy diet Try to eat 4 servings of dairy a day, or if you are lactose intolerant take a calcium with vitamin D daily.  Your vaccines are up to date.  Mammogram ordered.  Colon cancer screening options discussed.  She would prefer to move forward with colonoscopy.  Referral placed.

## 2018-04-11 NOTE — Patient Instructions (Signed)

## 2018-04-14 ENCOUNTER — Ambulatory Visit (INDEPENDENT_AMBULATORY_CARE_PROVIDER_SITE_OTHER): Payer: PRIVATE HEALTH INSURANCE

## 2018-04-14 DIAGNOSIS — R928 Other abnormal and inconclusive findings on diagnostic imaging of breast: Secondary | ICD-10-CM

## 2018-04-14 DIAGNOSIS — Z1231 Encounter for screening mammogram for malignant neoplasm of breast: Secondary | ICD-10-CM

## 2018-04-15 LAB — LIPID PANEL
CHOL/HDL RATIO: 2.5 (calc) (ref <5.0)
Cholesterol: 201 mg/dL — ABNORMAL HIGH (ref <200)
HDL: 81 mg/dL (ref >=50)
LDL CHOLESTEROL (CALC): 106 mg/dL — AB
Non-HDL Cholesterol (Calc): 120 mg/dL (calc) (ref <130)
TRIGLYCERIDES: 47 mg/dL (ref <150)

## 2018-04-15 LAB — COMPLETE METABOLIC PANEL WITH GFR
AG RATIO: 1.8 (calc) (ref 1.0–2.5)
ALT: 11 U/L (ref 6–29)
AST: 14 U/L (ref 10–35)
Albumin: 4.6 g/dL (ref 3.6–5.1)
Alkaline phosphatase (APISO): 54 U/L (ref 37–153)
BUN: 16 mg/dL (ref 7–25)
CALCIUM: 9.4 mg/dL (ref 8.6–10.4)
CO2: 28 mmol/L (ref 20–32)
Chloride: 104 mmol/L (ref 98–110)
Creat: 0.86 mg/dL (ref 0.50–1.05)
GFR, EST AFRICAN AMERICAN: 91 mL/min/{1.73_m2} (ref >=60)
GFR, EST NON AFRICAN AMERICAN: 79 mL/min/{1.73_m2} (ref >=60)
GLUCOSE: 97 mg/dL (ref 65–99)
Globulin: 2.5 g/dL (calc) (ref 1.9–3.7)
Potassium: 4.1 mmol/L (ref 3.5–5.3)
Sodium: 141 mmol/L (ref 135–146)
TOTAL PROTEIN: 7.1 g/dL (ref 6.1–8.1)
Total Bilirubin: 0.6 mg/dL (ref 0.2–1.2)

## 2018-04-15 LAB — HIV ANTIBODY (ROUTINE TESTING W REFLEX): HIV: NONREACTIVE

## 2018-04-18 ENCOUNTER — Other Ambulatory Visit: Payer: Self-pay | Admitting: Family Medicine

## 2018-04-18 DIAGNOSIS — R928 Other abnormal and inconclusive findings on diagnostic imaging of breast: Secondary | ICD-10-CM

## 2018-04-21 ENCOUNTER — Ambulatory Visit
Admission: RE | Admit: 2018-04-21 | Discharge: 2018-04-21 | Disposition: A | Discharge status: Home / Self Care | Payer: PRIVATE HEALTH INSURANCE | Source: Ambulatory Visit | Attending: Family Medicine | Admitting: Family Medicine

## 2018-04-21 ENCOUNTER — Other Ambulatory Visit: Payer: Self-pay

## 2018-04-21 ENCOUNTER — Other Ambulatory Visit: Payer: Self-pay | Admitting: Family Medicine

## 2018-04-21 DIAGNOSIS — N6489 Other specified disorders of breast: Secondary | ICD-10-CM

## 2018-04-21 DIAGNOSIS — R928 Other abnormal and inconclusive findings on diagnostic imaging of breast: Secondary | ICD-10-CM

## 2018-04-24 ENCOUNTER — Ambulatory Visit
Admission: RE | Admit: 2018-04-24 | Discharge: 2018-04-24 | Disposition: A | Discharge status: Home / Self Care | Payer: PRIVATE HEALTH INSURANCE | Source: Ambulatory Visit | Attending: Family Medicine | Admitting: Family Medicine

## 2018-04-24 ENCOUNTER — Other Ambulatory Visit: Payer: Self-pay

## 2018-04-24 ENCOUNTER — Telehealth: Payer: Self-pay | Admitting: Family Medicine

## 2018-04-24 DIAGNOSIS — N6489 Other specified disorders of breast: Secondary | ICD-10-CM

## 2018-04-24 NOTE — Telephone Encounter (Signed)
Sent message to Pt via MyChart regarding her referral that was placed.

## 2018-04-24 NOTE — Telephone Encounter (Signed)
°  Colonoscopy    Comments:  I would prefer end of April please on a Monday if possible  (requested via mychart)

## 2018-05-29 ENCOUNTER — Other Ambulatory Visit: Payer: Self-pay | Admitting: Surgery

## 2018-05-29 DIAGNOSIS — N6021 Fibroadenosis of right breast: Secondary | ICD-10-CM

## 2018-07-17 ENCOUNTER — Other Ambulatory Visit: Payer: Self-pay | Admitting: Surgery

## 2018-07-17 DIAGNOSIS — N6021 Fibroadenosis of right breast: Secondary | ICD-10-CM

## 2018-07-20 ENCOUNTER — Encounter: Payer: Self-pay | Admitting: Gastroenterology

## 2018-08-22 ENCOUNTER — Other Ambulatory Visit: Payer: Self-pay

## 2018-08-22 ENCOUNTER — Ambulatory Visit: Payer: PRIVATE HEALTH INSURANCE | Admitting: *Deleted

## 2018-08-22 VITALS — Ht 67.0 in | Wt 175.0 lb

## 2018-08-22 DIAGNOSIS — Z1211 Encounter for screening for malignant neoplasm of colon: Secondary | ICD-10-CM

## 2018-08-22 MED ORDER — NA SULFATE-K SULFATE-MG SULF 17.5-3.13-1.6 GM/177ML PO SOLN: 1.0000 | Freq: Once | ORAL | 0 refills | Status: AC

## 2018-08-22 NOTE — Progress Notes (Signed)

## 2018-09-06 ENCOUNTER — Telehealth: Payer: Self-pay | Admitting: Gastroenterology

## 2018-09-06 NOTE — Telephone Encounter (Signed)

## 2018-09-07 ENCOUNTER — Other Ambulatory Visit: Payer: Self-pay

## 2018-09-07 ENCOUNTER — Encounter: Payer: Self-pay | Admitting: Gastroenterology

## 2018-09-07 ENCOUNTER — Ambulatory Visit (AMBULATORY_SURGERY_CENTER): Payer: No Typology Code available for payment source | Admitting: Gastroenterology

## 2018-09-07 VITALS — BP 92/51 | HR 56 | Temp 97.9°F | Resp 15 | Ht 67.0 in | Wt 175.0 lb

## 2018-09-07 DIAGNOSIS — Z1211 Encounter for screening for malignant neoplasm of colon: Secondary | ICD-10-CM | POA: Diagnosis present

## 2018-09-07 MED ORDER — SODIUM CHLORIDE 0.9 % IV SOLN: 500.0000 mL | INTRAVENOUS | Status: DC

## 2018-09-07 NOTE — Progress Notes (Signed)
To PACU, VSS. Report to [email or web address] 

## 2018-09-07 NOTE — Op Note (Signed)
[facility] Patient Name: Cathy PainDawn Price Procedure Date: 09/07/2018 9:36 AM MRN: [id number] Endoscopist: Sherilyn CooterHenry L. Myrtie Neitheranis , MD Age: 53 Referring MD:  Date of Birth: [birth year] Gender: Female Account #: [id number] Procedure:                Colonoscopy Indications:              Screening for colorectal malignant neoplasm, This                            is the patient's first colonoscopy Medicines:                Monitored Anesthesia Care Procedure:                Pre-Anesthesia Assessment:                           - Prior to the procedure, a History and Physical                            was performed, and patient medications and                            allergies were reviewed. The patient's tolerance of                            previous anesthesia was also reviewed. The risks                            and benefits of the procedure and the sedation                            options and risks were discussed with the patient.                            All questions were answered, and informed consent                            was obtained. Prior Anticoagulants: The patient has                            taken no previous anticoagulant or antiplatelet                            agents. ASA Grade Assessment: II - A patient with                            mild systemic disease. After reviewing the risks                            and benefits, the patient was deemed in                            satisfactory condition to undergo the procedure.  After obtaining informed consent, the colonoscope                            was passed under direct vision. Throughout the                            procedure, the patient's blood pressure, pulse, and                            oxygen saturations were monitored continuously. The                            Colonoscope was introduced through the anus and                            advanced to the the  terminal ileum, with                            identification of the appendiceal orifice and IC                            valve. The colonoscopy was performed without                            difficulty. The patient tolerated the procedure                            well. The quality of the bowel preparation was                            excellent. The ileocecal valve, appendiceal                            orifice, and rectum were photographed. Scope In: 9:44:57 AM Scope Out: 9:57:55 AM Scope Withdrawal Time: 0 hours 7 minutes 13 seconds  Total Procedure Duration: 0 hours 12 minutes 58 seconds  Findings:                 The perianal and digital rectal examinations were                            normal.                           The terminal ileum appeared normal.                           The entire examined colon appeared normal on direct                            and retroflexion views. Complications:            No immediate complications. Estimated Blood Loss:     Estimated blood loss: none. Impression:               - The examined portion of the ileum was normal.                           -  The entire examined colon is normal on direct and                            retroflexion views.                           - No specimens collected. Recommendation:           - Patient has a contact number available for                            emergencies. The signs and symptoms of potential                            delayed complications were discussed with the                            patient. Return to normal activities tomorrow.                            Written discharge instructions were provided to the                            patient.                           - Resume previous diet.                           - Continue present medications.                           - Repeat colonoscopy in 10 years for screening                            purposes. Samar Dass L. Myrtie Neitheranis,  MD 09/07/2018 10:02:19 AM This report has been signed electronically.

## 2018-09-07 NOTE — Progress Notes (Signed)
Pt's states no medical or surgical changes since previsit or office visit.  JB temps and Vitals CW. SM

## 2018-09-07 NOTE — Patient Instructions (Signed)
YOU HAD AN ENDOSCOPIC PROCEDURE TODAY AT THE [facility]:   Refer to the procedure report that was given to you for any specific questions about what was found during the examination.  If the procedure report does not answer your questions, please call your gastroenterologist to clarify.  If you requested that your care partner not be given the details of your procedure findings, then the procedure report has been included in a sealed envelope for you to review at your convenience later.  YOU SHOULD EXPECT: Some feelings of bloating in the abdomen. Passage of more gas than usual.  Walking can help get rid of the air that was put into your GI tract during the procedure and reduce the bloating. If you had a lower endoscopy (such as a colonoscopy or flexible sigmoidoscopy) you may notice spotting of blood in your stool or on the toilet paper. If you underwent a bowel prep for your procedure, you may not have a normal bowel movement for a few days.  Please Note:  You might notice some irritation and congestion in your nose or some drainage.  This is from the oxygen used during your procedure.  There is no need for concern and it should clear up in a day or so.  SYMPTOMS TO REPORT IMMEDIATELY:   Following lower endoscopy (colonoscopy or flexible sigmoidoscopy):  Excessive amounts of blood in the stool  Significant tenderness or worsening of abdominal pains  Swelling of the abdomen that is new, acute  Fever of 100F or higher  For urgent or emergent issues, a gastroenterologist can be reached at any hour by calling [phone].   DIET:  We do recommend a small meal at first, but then you may proceed to your regular diet.  Drink plenty of fluids but you should avoid alcoholic beverages for 24 hours.  ACTIVITY:  You should plan to take it easy for the rest of today and you should NOT DRIVE or use heavy machinery until tomorrow (because of the sedation medicines used during the test).     FOLLOW UP: Our staff will call the number listed on your records 48-72 hours following your procedure to check on you and address any questions or concerns that you may have regarding the information given to you following your procedure. If we do not reach you, we will leave a message.  We will attempt to reach you two times.  During this call, we will ask if you have developed any symptoms of COVID 19. If you develop any symptoms (ie: fever, flu-like symptoms, shortness of breath, cough etc.) before then, please call [phone].  If you test positive for Covid 19 in the 2 weeks post procedure, please call and report this information to us.    If any biopsies were taken you will be contacted by phone or by letter within the next 1-3 weeks.  Please call us at [phone] if you have not heard about the biopsies in 3 weeks.    SIGNATURES/CONFIDENTIALITY: You and/or your care partner have signed paperwork which will be entered into your electronic medical record.  These signatures attest to the fact that that the information above on your After Visit Summary has been reviewed and is understood.  Full responsibility of the confidentiality of this discharge information lies with you and/or your care-partner. 

## 2018-09-11 ENCOUNTER — Telehealth: Payer: Self-pay

## 2018-09-11 NOTE — Telephone Encounter (Signed)
       Covid-19 screening questions   Do you now or have you had a fever in the last 14 days? No.  Do you have any respiratory symptoms of shortness of breath or cough now or in the last 14 days? No.  Do you have any family members or close contacts with diagnosed or suspected Covid-19 in the past 14 days? No.  Have you been tested for Covid-19 and found to be positive? No.       Follow up Call-  Call back number 09/07/2018  Post procedure Call Back phone  # [phone]  Permission to leave phone message Yes  Some recent data might be hidden     Patient questions:  Do you have a fever, pain , or abdominal swelling? No. Pain Score  0 *  Have you tolerated food without any problems? Yes.    Have you been able to return to your normal activities? Yes.    Do you have any questions about your discharge instructions: Diet   No. Medications  No. Follow up visit  No.  Do you have questions or concerns about your Care? No.  Actions: * If pain score is 4 or above: No action needed, pain <4.

## 2018-09-19 ENCOUNTER — Other Ambulatory Visit: Payer: Self-pay

## 2018-09-19 ENCOUNTER — Encounter (HOSPITAL_BASED_OUTPATIENT_CLINIC_OR_DEPARTMENT_OTHER): Payer: Self-pay | Admitting: *Deleted

## 2018-09-20 ENCOUNTER — Other Ambulatory Visit: Payer: Self-pay | Admitting: Surgery

## 2018-09-20 DIAGNOSIS — N6021 Fibroadenosis of right breast: Secondary | ICD-10-CM

## 2018-09-23 ENCOUNTER — Other Ambulatory Visit (HOSPITAL_COMMUNITY)
Admission: RE | Admit: 2018-09-23 | Discharge: 2018-09-23 | Disposition: A | Discharge status: Home / Self Care | Payer: No Typology Code available for payment source | Source: Ambulatory Visit | Attending: Surgery | Admitting: Surgery

## 2018-09-23 DIAGNOSIS — Z01812 Encounter for preprocedural laboratory examination: Secondary | ICD-10-CM | POA: Diagnosis not present

## 2018-09-23 DIAGNOSIS — Z20828 Contact with and (suspected) exposure to other viral communicable diseases: Secondary | ICD-10-CM | POA: Diagnosis not present

## 2018-09-23 LAB — SARS CORONAVIRUS 2 (TAT 6-24 HRS): SARS Coronavirus 2: NEGATIVE

## 2018-09-26 ENCOUNTER — Other Ambulatory Visit: Payer: Self-pay

## 2018-09-26 ENCOUNTER — Ambulatory Visit
Admission: RE | Admit: 2018-09-26 | Discharge: 2018-09-26 | Disposition: A | Discharge status: Home / Self Care | Payer: No Typology Code available for payment source | Source: Ambulatory Visit | Attending: Surgery | Admitting: Surgery

## 2018-09-26 DIAGNOSIS — N6021 Fibroadenosis of right breast: Secondary | ICD-10-CM

## 2018-09-26 NOTE — H&P (Signed)
Cathy Price  [facility] Patient #: [id number] DOB: Jun 21, 1965 Married / Language: English / Race: White Female   History of Present Illness  The patient is a 53 year old female who presents with a complaint of Breast problems. This patient is referred by Dr. Nani Gasseratherine Metheney after the recent diagnosis of a complex sclerosing lesion of the right breast. The patient had an area of architectural distortion with a small calcification in the right breast. She underwent diagnostic mammograms, ultrasound, and eventual biopsy. No specific mass seen. The standard head and biopsy showed a complex sclerosing lesion. She has no previous family history breast cancer and has had no previous surgery on her breast. She is otherwise healthy without complaints.   Past Surgical History  Breast Biopsy  Right.  Diagnostic Studies History Cathy Price(Cathy Price, CMA;  Colonoscopy  never Mammogram  within last year Pap Smear  1-5 years ago  Allergies (Cathy Price, CMA;11:44 AM) Amoxicillin *PENICILLINS*  Allergies Reconciled   Medication History (Cathy Price, CMA;  No Current Medications Medications Reconciled  Social History Alcohol use  Moderate alcohol use. Caffeine use  Coffee. No drug use  Tobacco use  Former smoker.  Family History  Colon Polyps  Father. Heart disease in female family member before age 51  Hypertension  Mother.  Pregnancy / Birth History Cathy Price(Cathy Price, CMA;  Age at menarche  15 years. Age of menopause  2546-52 Contraceptive History  Oral contraceptives. Gravida  3 Maternal age  51-25 Para  2  Other Problems Lump In Breast     Review of Systems Cathy Price(Cathy Price CMA;  General Not Present- Appetite Loss, Chills, Fatigue, Fever, Night Sweats, Weight Gain and Weight Loss. Skin Not Present- Change in Wart/Mole, Dryness, Hives, Jaundice, New Lesions, Non-Healing Wounds, Rash and Ulcer. HEENT Present- Seasonal Allergies and  Wears glasses/contact lenses. Not Present- Earache, Hearing Loss, Hoarseness, Nose Bleed, Oral Ulcers, Ringing in the Ears, Sinus Pain, Sore Throat, Visual Disturbances and Yellow Eyes. Respiratory Not Present- Bloody sputum, Chronic Cough, Difficulty Breathing, Snoring and Wheezing. Breast Present- Breast Mass. Not Present- Breast Pain, Nipple Discharge and Skin Changes. Cardiovascular Not Present- Chest Pain, Difficulty Breathing Lying Down, Leg Cramps, Palpitations, Rapid Heart Rate, Shortness of Breath and Swelling of Extremities. Gastrointestinal Not Present- Abdominal Pain, Bloating, Bloody Stool, Change in Bowel Habits, Chronic diarrhea, Constipation, Difficulty Swallowing, Excessive gas, Gets full quickly at meals, Hemorrhoids, Indigestion, Nausea, Rectal Pain and Vomiting. Female Genitourinary Not Present- Frequency, Nocturia, Painful Urination, Pelvic Pain and Urgency. Musculoskeletal Not Present- Back Pain, Joint Pain, Joint Stiffness, Muscle Pain, Muscle Weakness and Swelling of Extremities. Neurological Not Present- Decreased Memory, Fainting, Headaches, Numbness, Seizures, Tingling, Tremor, Trouble walking and Weakness. Psychiatric Not Present- Anxiety, Bipolar, Change in Sleep Pattern, Depression, Fearful and Frequent crying. Endocrine Not Present- Cold Intolerance, Excessive Hunger, Hair Changes, Heat Intolerance, Hot flashes and New Diabetes. Hematology Present- Easy Bruising. Not Present- Blood Thinners, Excessive bleeding, Gland problems, HIV and Persistent Infections.  Vitals  Weight: 176.8 lb Height: 67in Body Surface Area: 1.92 m Body Mass Index: 27.69 kg/m  Temp.: 98.38F (Oral)  Pulse: 97 (Regular)  P.OX: 92% (Room air) BP: 110/68(Sitting, Left Arm, Standard)    Physical Exam  General Mental Status-Alert. General Appearance-Consistent with stated age. Hydration-Well hydrated. Voice-Normal.  Head and Neck Head-normocephalic, atraumatic with no  lesions or palpable masses. Trachea-midline. Thyroid Gland Characteristics - normal size and consistency.  Eye Eyeball - Bilateral-Extraocular movements intact. Sclera/Conjunctiva - Bilateral-No scleral icterus.  Chest and Lung Exam  Chest and lung exam reveals -quiet, even and easy respiratory effort with no use of accessory muscles and on auscultation, normal breath sounds, no adventitious sounds and normal vocal resonance. Inspection Chest Wall - Normal. Back - normal.  Breast Breast - Left-Symmetric, Non Tender, No Biopsy scars, no Dimpling, No Inflammation, No Lumpectomy scars, No Mastectomy scars, No Peau d' Orange. Breast - Right-Symmetric, Non Tender, No Biopsy scars, no Dimpling, No Inflammation, No Lumpectomy scars, No Mastectomy scars, No Peau d' Orange. Breast Lump-No Palpable Breast Mass.  Cardiovascular Cardiovascular examination reveals -normal heart sounds, regular rate and rhythm with no murmurs and normal pedal pulses bilaterally.  Abdomen Inspection Inspection of the abdomen reveals - No Hernias. Skin - Scar - no surgical scars. Palpation/Percussion Palpation and Percussion of the abdomen reveal - Soft, Non Tender, No Rebound tenderness, No Rigidity (guarding) and No hepatosplenomegaly. Auscultation Auscultation of the abdomen reveals - Bowel sounds normal.  Neurologic - Did not examine.  Musculoskeletal - Did not examine.  Lymphatic Head & Neck  General Head & Neck Lymphatics: Bilateral - Description - Normal. Axillary  General Axillary Region: Bilateral - Description - Normal. Tenderness - Non Tender. Femoral & Inguinal - Did not examine.    Assessment & Plan  SCLEROSING ADENOSIS OF BREAST, RIGHT (N60.21) Impression: I have reviewed her mammogram, ultrasound, and pathology results. I gave her a copy of the pathology results. We discussed the findings on pathology of the complex sclerosing lesion. We discussed the need for a  lumpectomy of the right breast. I discussed a radioactive seed guided right breast thickening with her in detail. I discussed the risk which includes but is not limited to bleeding, infection, and the need for further surgery for malignancy is found. We also discussed postoperative recovery. She understands and wishes to proceed with surgery

## 2018-09-26 NOTE — Progress Notes (Signed)

## 2018-09-26 NOTE — Anesthesia Preprocedure Evaluation (Addendum)
Anesthesia Evaluation  Patient identified by MRN, date of birth, ID band Patient awake    Reviewed: Allergy & Precautions, H&P , NPO status , Patient's Chart, lab work & pertinent test results  Airway Mallampati: I  TM Distance: >3 FB Neck ROM: Full    Dental no notable dental hx. (+) Teeth Intact, Dental Advisory Given   Pulmonary neg pulmonary ROS, former smoker,    Pulmonary exam normal breath sounds clear to auscultation       Cardiovascular Exercise Tolerance: Good negative cardio ROS Normal cardiovascular exam Rhythm:Regular Rate:Normal     Neuro/Psych negative neurological ROS  negative psych ROS   GI/Hepatic negative GI ROS, Neg liver ROS,   Endo/Other  negative endocrine ROS  Renal/GU negative Renal ROS  negative genitourinary   Musculoskeletal negative musculoskeletal ROS (+)   Abdominal   Peds negative pediatric ROS (+)  Hematology negative hematology ROS (+)   Anesthesia Other Findings   Reproductive/Obstetrics negative OB ROS                            Anesthesia Physical Anesthesia Plan  ASA: II  Anesthesia Plan: General   Post-op Pain Management:    Induction:   PONV Risk Score and Plan: 3 and Dexamethasone, Ondansetron and Treatment may vary due to age or medical condition  Airway Management Planned: Oral ETT and LMA  Additional Equipment:   Intra-op Plan:   Post-operative Plan:   Informed Consent: I have reviewed the patients History and Physical, chart, labs and discussed the procedure including the risks, benefits and alternatives for the proposed anesthesia with the patient or authorized representative who has indicated his/her understanding and acceptance.       Plan Discussed with: Anesthesiologist, Surgeon and CRNA  Anesthesia Plan Comments:        Anesthesia Quick Evaluation

## 2018-09-27 ENCOUNTER — Ambulatory Visit (HOSPITAL_BASED_OUTPATIENT_CLINIC_OR_DEPARTMENT_OTHER): Payer: No Typology Code available for payment source | Admitting: Anesthesiology

## 2018-09-27 ENCOUNTER — Ambulatory Visit (HOSPITAL_BASED_OUTPATIENT_CLINIC_OR_DEPARTMENT_OTHER)
Admission: RE | Admit: 2018-09-27 | Discharge: 2018-09-27 | Disposition: A | Discharge status: Home / Self Care | Payer: No Typology Code available for payment source | Attending: Surgery | Admitting: Surgery

## 2018-09-27 ENCOUNTER — Ambulatory Visit
Admission: RE | Admit: 2018-09-27 | Discharge: 2018-09-27 | Disposition: A | Discharge status: Home / Self Care | Payer: PRIVATE HEALTH INSURANCE | Source: Ambulatory Visit | Attending: Surgery | Admitting: Surgery

## 2018-09-27 ENCOUNTER — Other Ambulatory Visit: Payer: Self-pay

## 2018-09-27 ENCOUNTER — Encounter (HOSPITAL_BASED_OUTPATIENT_CLINIC_OR_DEPARTMENT_OTHER): Payer: Self-pay

## 2018-09-27 ENCOUNTER — Encounter (HOSPITAL_BASED_OUTPATIENT_CLINIC_OR_DEPARTMENT_OTHER)
Admission: RE | Disposition: A | Discharge status: Home / Self Care | Payer: Self-pay | Source: Home / Self Care | Attending: Surgery

## 2018-09-27 DIAGNOSIS — N6021 Fibroadenosis of right breast: Secondary | ICD-10-CM

## 2018-09-27 DIAGNOSIS — Z87891 Personal history of nicotine dependence: Secondary | ICD-10-CM | POA: Diagnosis not present

## 2018-09-27 HISTORY — PX: BREAST LUMPECTOMY WITH RADIOACTIVE SEED LOCALIZATION: SHX6424

## 2018-09-27 HISTORY — DX: Unspecified lump in the right breast, unspecified quadrant: N63.10

## 2018-09-27 SURGERY — BREAST LUMPECTOMY WITH RADIOACTIVE SEED LOCALIZATION
Anesthesia: General | Site: Breast | Laterality: Right

## 2018-09-27 MED ORDER — OXYCODONE HCL 5 MG PO TABS: 5.0000 mg | ORAL_TABLET | Freq: Once | ORAL | Status: DC | PRN

## 2018-09-27 MED ORDER — CHLORHEXIDINE GLUCONATE CLOTH 2 % EX PADS: 6.0000 | MEDICATED_PAD | Freq: Once | CUTANEOUS | Status: DC

## 2018-09-27 MED ORDER — BUPIVACAINE-EPINEPHRINE 0.5% -1:200000 IJ SOLN
INTRAMUSCULAR | Status: DC | PRN
  Administered 2018-09-27: 10 mL

## 2018-09-27 MED ORDER — MIDAZOLAM HCL 2 MG/2ML IJ SOLN
1.0000 mg | INTRAMUSCULAR | Status: DC | PRN
  Administered 2018-09-27: 2 mg via INTRAVENOUS

## 2018-09-27 MED ORDER — ONDANSETRON HCL 4 MG/2ML IJ SOLN
INTRAMUSCULAR | Status: AC
  Filled 2018-09-27: qty 4

## 2018-09-27 MED ORDER — LACTATED RINGERS IV SOLN
INTRAVENOUS | Status: DC
  Administered 2018-09-27: 08:00:00 via INTRAVENOUS

## 2018-09-27 MED ORDER — DEXAMETHASONE SODIUM PHOSPHATE 4 MG/ML IJ SOLN
INTRAMUSCULAR | Status: DC | PRN
  Administered 2018-09-27: 10 mg via INTRAVENOUS

## 2018-09-27 MED ORDER — CIPROFLOXACIN IN D5W 400 MG/200ML IV SOLN
400.0000 mg | INTRAVENOUS | Status: AC
  Administered 2018-09-27: 400 mg via INTRAVENOUS

## 2018-09-27 MED ORDER — FENTANYL CITRATE (PF) 100 MCG/2ML IJ SOLN
50.0000 ug | INTRAMUSCULAR | Status: DC | PRN
  Administered 2018-09-27: 100 ug via INTRAVENOUS

## 2018-09-27 MED ORDER — GABAPENTIN 300 MG PO CAPS
300.0000 mg | ORAL_CAPSULE | ORAL | Status: AC
  Administered 2018-09-27: 300 mg via ORAL

## 2018-09-27 MED ORDER — LIDOCAINE HCL (CARDIAC) PF 100 MG/5ML IV SOSY
PREFILLED_SYRINGE | INTRAVENOUS | Status: DC | PRN
  Administered 2018-09-27: 60 mg via INTRAVENOUS

## 2018-09-27 MED ORDER — ACETAMINOPHEN 325 MG PO TABS: 325.0000 mg | ORAL_TABLET | ORAL | Status: DC | PRN

## 2018-09-27 MED ORDER — OXYCODONE HCL 5 MG PO TABS: 5.0000 mg | ORAL_TABLET | Freq: Four times a day (QID) | ORAL | 0 refills | Status: DC | PRN

## 2018-09-27 MED ORDER — GABAPENTIN 300 MG PO CAPS
ORAL_CAPSULE | ORAL | Status: AC
  Filled 2018-09-27: qty 1

## 2018-09-27 MED ORDER — ACETAMINOPHEN 500 MG PO TABS
ORAL_TABLET | ORAL | Status: AC
  Filled 2018-09-27: qty 2

## 2018-09-27 MED ORDER — MEPERIDINE HCL 25 MG/ML IJ SOLN: 6.2500 mg | INTRAMUSCULAR | Status: DC | PRN

## 2018-09-27 MED ORDER — CELECOXIB 200 MG PO CAPS
ORAL_CAPSULE | ORAL | Status: AC
  Filled 2018-09-27: qty 1

## 2018-09-27 MED ORDER — PROPOFOL 500 MG/50ML IV EMUL
INTRAVENOUS | Status: AC
  Filled 2018-09-27: qty 100

## 2018-09-27 MED ORDER — CELECOXIB 200 MG PO CAPS
200.0000 mg | ORAL_CAPSULE | ORAL | Status: AC
  Administered 2018-09-27: 200 mg via ORAL

## 2018-09-27 MED ORDER — ACETAMINOPHEN 500 MG PO TABS
1000.0000 mg | ORAL_TABLET | ORAL | Status: AC
  Administered 2018-09-27: 07:00:00 1000 mg via ORAL

## 2018-09-27 MED ORDER — SCOPOLAMINE 1 MG/3DAYS TD PT72: 1.0000 | MEDICATED_PATCH | Freq: Once | TRANSDERMAL | Status: DC

## 2018-09-27 MED ORDER — ONDANSETRON HCL 4 MG/2ML IJ SOLN: 4.0000 mg | Freq: Once | INTRAMUSCULAR | Status: DC | PRN

## 2018-09-27 MED ORDER — ACETAMINOPHEN 160 MG/5ML PO SOLN: 325.0000 mg | ORAL | Status: DC | PRN

## 2018-09-27 MED ORDER — MIDAZOLAM HCL 2 MG/2ML IJ SOLN
INTRAMUSCULAR | Status: AC
  Filled 2018-09-27: qty 2

## 2018-09-27 MED ORDER — PROPOFOL 10 MG/ML IV BOLUS
INTRAVENOUS | Status: DC | PRN
  Administered 2018-09-27: 200 mg via INTRAVENOUS

## 2018-09-27 MED ORDER — FENTANYL CITRATE (PF) 100 MCG/2ML IJ SOLN
INTRAMUSCULAR | Status: AC
  Filled 2018-09-27: qty 2

## 2018-09-27 MED ORDER — CIPROFLOXACIN IN D5W 400 MG/200ML IV SOLN
INTRAVENOUS | Status: AC
  Filled 2018-09-27: qty 200

## 2018-09-27 MED ORDER — LIDOCAINE 2% (20 MG/ML) 5 ML SYRINGE
INTRAMUSCULAR | Status: AC
  Filled 2018-09-27: qty 10

## 2018-09-27 MED ORDER — OXYCODONE HCL 5 MG/5ML PO SOLN: 5.0000 mg | Freq: Once | ORAL | Status: DC | PRN

## 2018-09-27 MED ORDER — DEXAMETHASONE SODIUM PHOSPHATE 10 MG/ML IJ SOLN
INTRAMUSCULAR | Status: AC
  Filled 2018-09-27: qty 1

## 2018-09-27 MED ORDER — FENTANYL CITRATE (PF) 100 MCG/2ML IJ SOLN: 25.0000 ug | INTRAMUSCULAR | Status: DC | PRN

## 2018-09-27 SURGICAL SUPPLY — 43 items
APPLIER CLIP 9.375 MED OPEN (MISCELLANEOUS)
BINDER BREAST LRG (GAUZE/BANDAGES/DRESSINGS) ×2 IMPLANT
BLADE HEX COATED 2.75 (ELECTRODE) ×2 IMPLANT
BLADE SURG 15 STRL LF DISP TIS (BLADE) ×1 IMPLANT
BLADE SURG 15 STRL SS (BLADE) ×1
CANISTER SUC SOCK COL 7IN (MISCELLANEOUS) IMPLANT
CANISTER SUCT 1200ML W/VALVE (MISCELLANEOUS) IMPLANT
CHLORAPREP W/TINT 26 (MISCELLANEOUS) ×2 IMPLANT
CLIP APPLIE 9.375 MED OPEN (MISCELLANEOUS) IMPLANT
CLIP VESOCCLUDE SM WIDE 6/CT (CLIP) ×2 IMPLANT
COVER BACK TABLE REUSABLE LG (DRAPES) ×2 IMPLANT
COVER MAYO STAND REUSABLE (DRAPES) ×2 IMPLANT
COVER PROBE W GEL 5X96 (DRAPES) ×2 IMPLANT
COVER WAND RF STERILE (DRAPES) IMPLANT
DECANTER SPIKE VIAL GLASS SM (MISCELLANEOUS) ×2 IMPLANT
DERMABOND ADVANCED (GAUZE/BANDAGES/DRESSINGS) ×1
DERMABOND ADVANCED .7 DNX12 (GAUZE/BANDAGES/DRESSINGS) ×1 IMPLANT
DRAPE LAPAROSCOPIC ABDOMINAL (DRAPES) ×2 IMPLANT
DRAPE UTILITY XL STRL (DRAPES) ×2 IMPLANT
ELECT REM PT RETURN 9FT ADLT (ELECTROSURGICAL) ×2
ELECTRODE REM PT RTRN 9FT ADLT (ELECTROSURGICAL) ×1 IMPLANT
GAUZE SPONGE 4X4 12PLY STRL LF (GAUZE/BANDAGES/DRESSINGS) IMPLANT
GLOVE SURG SIGNA 7.5 PF LTX (GLOVE) ×2 IMPLANT
GOWN STRL REUS W/ TWL LRG LVL3 (GOWN DISPOSABLE) ×1 IMPLANT
GOWN STRL REUS W/ TWL XL LVL3 (GOWN DISPOSABLE) ×1 IMPLANT
GOWN STRL REUS W/TWL LRG LVL3 (GOWN DISPOSABLE) ×1
GOWN STRL REUS W/TWL XL LVL3 (GOWN DISPOSABLE) ×1
KIT MARKER MARGIN INK (KITS) ×2 IMPLANT
NEEDLE HYPO 25X1 1.5 SAFETY (NEEDLE) ×2 IMPLANT
NS IRRIG 1000ML POUR BTL (IV SOLUTION) IMPLANT
PACK BASIN DAY SURGERY FS (CUSTOM PROCEDURE TRAY) ×2 IMPLANT
PENCIL BUTTON HOLSTER BLD 10FT (ELECTRODE) ×2 IMPLANT
SLEEVE SCD COMPRESS KNEE MED (MISCELLANEOUS) ×2 IMPLANT
SPONGE LAP 4X18 RFD (DISPOSABLE) ×2 IMPLANT
SUT MNCRL AB 4-0 PS2 18 (SUTURE) ×2 IMPLANT
SUT SILK 2 0 SH (SUTURE) IMPLANT
SUT VIC AB 3-0 SH 27 (SUTURE) ×1
SUT VIC AB 3-0 SH 27X BRD (SUTURE) ×1 IMPLANT
SYR CONTROL 10ML LL (SYRINGE) ×2 IMPLANT
TOWEL GREEN STERILE FF (TOWEL DISPOSABLE) ×2 IMPLANT
TRAY FAXITRON CT DISP (TRAY / TRAY PROCEDURE) ×2 IMPLANT
TUBE CONNECTING 20X1/4 (TUBING) ×2 IMPLANT
YANKAUER SUCT BULB TIP NO VENT (SUCTIONS) IMPLANT

## 2018-09-27 NOTE — Anesthesia Postprocedure Evaluation (Signed)
Anesthesia Post Note  Patient: Cathy Price  Procedure(s) Performed: RIGHT BREAST LUMPECTOMY WITH RADIOACTIVE SEED LOCALIZATION (Right Breast)     Patient location during evaluation: PACU Anesthesia Type: General Level of consciousness: awake and alert Pain management: pain level controlled Vital Signs Assessment: post-procedure vital signs reviewed and stable Respiratory status: spontaneous breathing, nonlabored ventilation, respiratory function stable and patient connected to nasal cannula oxygen Cardiovascular status: blood pressure returned to baseline and stable Postop Assessment: no apparent nausea or vomiting Anesthetic complications: no    Last Vitals:  Vitals:   09/27/18 0945 09/27/18 1015  BP:    Pulse: 68 74  Resp: 16 18  Temp: 36.5 C 36.6 C  SpO2: 100% 99%    Last Pain:  Vitals:   09/27/18 1015  TempSrc:   PainSc: 0-No pain                 [name]

## 2018-09-27 NOTE — Interval H&P Note (Signed)
History and Physical Interval Note:no change in H and P  09/27/2018 7:48 AM  Rashi Kirtley  has presented today for surgery, with the diagnosis of COMPLEX SCLEROSING LESION RIGHT BREAST.  The various methods of treatment have been discussed with the patient and family. After consideration of risks, benefits and other options for treatment, the patient has consented to  Procedure(s): RIGHT BREAST LUMPECTOMY WITH RADIOACTIVE SEED LOCALIZATION (Right) as a surgical intervention.  The patient's history has been reviewed, patient examined, no change in status, stable for surgery.  I have reviewed the patient's chart and labs.  Questions were answered to the patient's satisfaction.     Coralie Keens

## 2018-09-27 NOTE — Discharge Instructions (Signed)
No Tylenol until 1:15pm No ibuprofen until 3:15pm    [employer] Phone Number [phone]  BREAST BIOPSY/ PARTIAL MASTECTOMY: POST OP INSTRUCTIONS  Always review your discharge instruction sheet given to you by the facility where your surgery was performed.  IF YOU HAVE DISABILITY OR FAMILY LEAVE FORMS, YOU MUST BRING THEM TO THE OFFICE FOR PROCESSING.  DO NOT GIVE THEM TO YOUR DOCTOR.  1. A prescription for pain medication may be given to you upon discharge.  Take your pain medication as prescribed, if needed.  If narcotic pain medicine is not needed, then you may take acetaminophen (Tylenol) or ibuprofen (Advil) as needed. 2. Take your usually prescribed medications unless otherwise directed 3. If you need a refill on your pain medication, please contact your pharmacy.  They will contact our office to request authorization.  Prescriptions will not be filled after 5pm or on week-ends. 4. You should eat very light the first 24 hours after surgery, such as soup, crackers, pudding, etc.  Resume your normal diet the day after surgery. 5. Most patients will experience some swelling and bruising in the breast.  Ice packs and a good support bra will help.  Swelling and bruising can take several days to resolve.  6. It is common to experience some constipation if taking pain medication after surgery.  Increasing fluid intake and taking a stool softener will usually help or prevent this problem from occurring.  A mild laxative (Milk of Magnesia or Miralax) should be taken according to package directions if there are no bowel movements after 48 hours. 7. Unless discharge instructions indicate otherwise, you may remove your bandages 24-48 hours after surgery, and you may shower at that time.  You may have steri-strips (small skin tapes) in place directly over the incision.  These strips should be left on the skin for 7-10 days.  If your surgeon used skin glue on the incision, you  may shower in 24 hours.  The glue will flake off over the next 2-3 weeks.  Any sutures or staples will be removed at the office during your follow-up visit. 8. ACTIVITIES:  You may resume regular daily activities (gradually increasing) beginning the next day.  Wearing a good support bra or sports bra minimizes pain and swelling.  You may have sexual intercourse when it is comfortable. a. You may drive when you no longer are taking prescription pain medication, you can comfortably wear a seatbelt, and you can safely maneuver your car and apply brakes. b. RETURN TO WORK:  ______________________________________________________________________________________ 9. You should see your doctor in the office for a follow-up appointment approximately two weeks after your surgery.  Your doctors nurse will typically make your follow-up appointment when she calls you with your pathology report.  Expect your pathology report 2-3 business days after your surgery.  You may call to check if you do not hear from [place] after three days. 10. OTHER INSTRUCTIONS: Okay to shower starting tomorrow 11. Ice pack, Tylenol, and ibuprofen also for pain 12. No vigorous activity for 1 week ____________No tylenol today until after 1pm.  No ibuprofen until after 3pm today. ___________________________________________________________________________________ _____________________________________________________________________________________________________________________________________ _____________________________________________________________________________________________________________________________________ _____________________________________________________________________________________________________________________________________  WHEN TO CALL YOUR DOCTOR: 1. Fever over 101.0 2. Nausea and/or vomiting. 3. Extreme swelling or bruising. 4. Continued bleeding from incision. 5. Increased pain, redness, or drainage from  the incision.  The clinic staff is available to answer your questions during regular business hours.  Please dont hesitate to call and ask to speak to  one of the nurses for clinical concerns.  If you have a medical emergency, go to the nearest emergency room or call 911.  [facility] always [facility] call at the hospital.  For further questions, please visit [email or web address]     Post Anesthesia Home Care Instructions  Activity: Get plenty of rest for the remainder of the day. A responsible individual must stay with you for 24 hours following the procedure.  For the next 24 hours, DO NOT: -Drive a car -Advertising copywriterperate machinery -Drink alcoholic beverages -Take any medication unless instructed by your physician -Make any legal decisions or sign important papers.  Meals: Start with liquid foods such as gelatin or soup. Progress to regular foods as tolerated. Avoid greasy, spicy, heavy foods. If nausea and/or vomiting occur, drink only clear liquids until the nausea and/or vomiting subsides. Call your physician if vomiting continues.  Special Instructions/Symptoms: Your throat may feel dry or sore from the anesthesia or the breathing tube placed in your throat during surgery. If this causes discomfort, gargle with warm salt water. The discomfort should disappear within 24 hours.  If you had a scopolamine patch placed behind your ear for the management of post- operative nausea and/or vomiting:  1. The medication in the patch is effective for 72 hours, after which it should be removed.  Wrap patch in a tissue and discard in the trash. Wash hands thoroughly with soap and water. 2. You may remove the patch earlier than 72 hours if you experience unpleasant side effects which may include dry mouth, dizziness or visual disturbances. 3. Avoid touching the patch. Wash your hands with soap and water after contact with the patch.

## 2018-09-27 NOTE — Anesthesia Procedure Notes (Signed)
Procedure Name: LMA Insertion Date/Time: 09/27/2018 8:24 AM Performed by: Signe Colt, CRNA Pre-anesthesia Checklist: Patient identified, Emergency Drugs available, Suction available and Patient being monitored Patient Re-evaluated:Patient Re-evaluated prior to induction Oxygen Delivery Method: Circle system utilized Preoxygenation: Pre-oxygenation with 100% oxygen Induction Type: IV induction Ventilation: Mask ventilation without difficulty LMA: LMA inserted LMA Size: 4.0 Number of attempts: 1 Airway Equipment and Method: Bite block Placement Confirmation: positive ETCO2 Tube secured with: Tape Dental Injury: Teeth and Oropharynx as per pre-operative assessment

## 2018-09-27 NOTE — Transfer of Care (Signed)
Immediate Anesthesia Transfer of Care Note  Patient: Cathy Price  Procedure(s) Performed: RIGHT BREAST LUMPECTOMY WITH RADIOACTIVE SEED LOCALIZATION (Right Breast)  Patient Location: PACU  Anesthesia Type:General  Level of Consciousness: sedated and patient cooperative  Airway & Oxygen Therapy: Patient Spontanous Breathing and Patient connected to face mask oxygen  Post-op Assessment: Report given to RN and Post -op Vital signs reviewed and stable  Post vital signs: Reviewed and stable  Last Vitals:  Vitals Value Taken Time  BP 85/49 09/27/18 0900  Temp    Pulse 57 09/27/18 0901  Resp 23 09/27/18 0901  SpO2 98 % 09/27/18 0901  Vitals shown include unvalidated device data.  Last Pain:  Vitals:   09/27/18 0702  TempSrc: Oral  PainSc: 0-No pain      Patients Stated Pain Goal: 5 (38/17/71 1657)  Complications: No apparent anesthesia complications

## 2018-09-27 NOTE — Op Note (Signed)
RIGHT BREAST LUMPECTOMY WITH RADIOACTIVE SEED LOCALIZATION  Procedure Note  Cathy Price 09/27/2018   Pre-op Diagnosis: COMPLEX SCLEROSING LESION RIGHT BREAST     Post-op Diagnosis: same  Procedure(s): RIGHT BREAST LUMPECTOMY WITH RADIOACTIVE SEED LOCALIZATION  Surgeon(s): Coralie Keens, MD  Anesthesia: General  Staff:  Circulator: Patric Dykes, RN Scrub Person: Izora Ribas, RN  Estimated Blood Loss: Minimal               Specimens: sent to path  Indications: This is a 53 year old female who was found to have an abnormality on mammography of the right breast.  She underwent stereotactic biopsy showing a complex sclerosing lesion.  No malignancy was identified.  The decision was made to perform a right breast lumpectomy for complete histologic evaluation of this abnormality.  Procedure: The patient was brought to the operating room and identified as the correct patient.  She was placed upon the operating room table and general anesthesia was induced.   Her right breast was then prepped and draped in usual sterile fashion.  Using the neoprobe, identified an area of increased uptake in the right breast that was retroareolar.  I anesthetized the skin of the upper edge of the areola with Marcaine.  I then made incision with a scalpel.  I then dissected down to the radioactive seed with the aid of the neoprobe.  I performed a lumpectomy with the cautery staying around the seed.  Once it was excised, I marked the biopsy specimen with marker paint.  An x-ray was performed confirming the radioactive seed and previous biopsy marker were in the specimen.  It was then sent to pathology for evaluation.  Hemostasis was achieved with cautery.  I anesthetized the wound further with Marcaine.  I then closed the subcutaneous tissue with interrupted 3-0 Vicryl sutures and closed skin with a running 4-0 Monocryl.  Dermabond was then applied.  The patient tolerated the procedure well.  All  sponge needle and instrument counts were correct at the end of the procedure.  The patient was then extubated in the operating room and taken in a stable condition to the recovery room.          Coralie Keens   Date: 09/27/2018  Time: 8:57 AM

## 2018-09-28 ENCOUNTER — Encounter (HOSPITAL_BASED_OUTPATIENT_CLINIC_OR_DEPARTMENT_OTHER): Payer: Self-pay | Admitting: Surgery

## 2019-04-20 ENCOUNTER — Other Ambulatory Visit: Payer: Self-pay | Admitting: Surgery

## 2019-04-20 DIAGNOSIS — N6021 Fibroadenosis of right breast: Secondary | ICD-10-CM

## 2019-04-27 ENCOUNTER — Other Ambulatory Visit: Payer: Self-pay

## 2019-04-27 ENCOUNTER — Ambulatory Visit: Payer: No Typology Code available for payment source

## 2019-04-27 ENCOUNTER — Other Ambulatory Visit: Payer: Self-pay | Admitting: Surgery

## 2019-04-27 ENCOUNTER — Ambulatory Visit
Admission: RE | Admit: 2019-04-27 | Discharge: 2019-04-27 | Disposition: A | Discharge status: Home / Self Care | Payer: Self-pay | Source: Ambulatory Visit | Attending: Surgery | Admitting: Surgery

## 2019-04-27 DIAGNOSIS — R921 Mammographic calcification found on diagnostic imaging of breast: Secondary | ICD-10-CM

## 2019-04-27 DIAGNOSIS — N6021 Fibroadenosis of right breast: Secondary | ICD-10-CM

## 2019-06-21 DIAGNOSIS — Z03818 Encounter for observation for suspected exposure to other biological agents ruled out: Secondary | ICD-10-CM | POA: Diagnosis not present

## 2019-06-21 DIAGNOSIS — Z20828 Contact with and (suspected) exposure to other viral communicable diseases: Secondary | ICD-10-CM | POA: Diagnosis not present

## 2019-10-29 ENCOUNTER — Other Ambulatory Visit: Payer: Self-pay

## 2019-10-29 ENCOUNTER — Ambulatory Visit
Admission: RE | Admit: 2019-10-29 | Discharge: 2019-10-29 | Disposition: A | Discharge status: Home / Self Care | Payer: BC Managed Care – PPO | Source: Ambulatory Visit | Attending: Surgery | Admitting: Surgery

## 2019-10-29 DIAGNOSIS — R921 Mammographic calcification found on diagnostic imaging of breast: Secondary | ICD-10-CM | POA: Diagnosis not present

## 2020-03-17 IMAGING — MG MM PLC BREAST LOC DEV 1ST LESION INC*R*
6 series · 6 of 6 positions shown · non-contrast
Comparison: Previous exam(s).

CLINICAL DATA: Localization for surgery

EXAM:
MAMMOGRAPHIC GUIDED RADIOACTIVE SEED LOCALIZATION OF THE RIGHT
BREAST

[R ML (1 of 3)]
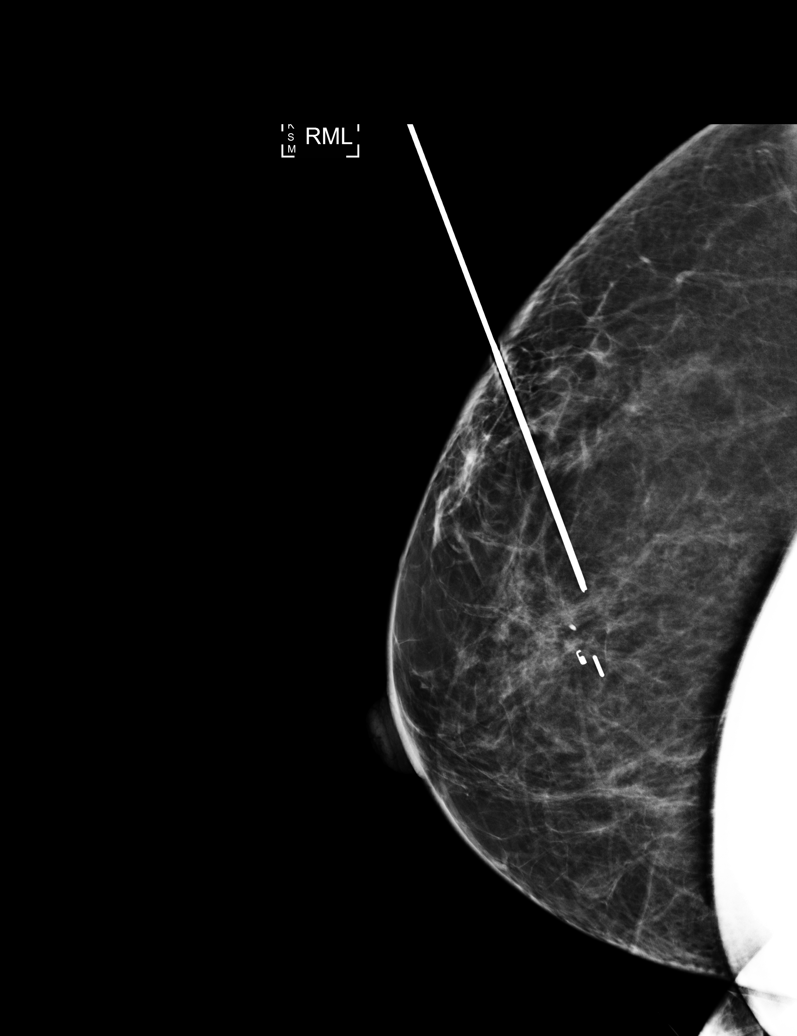

[R ML (2 of 3)]
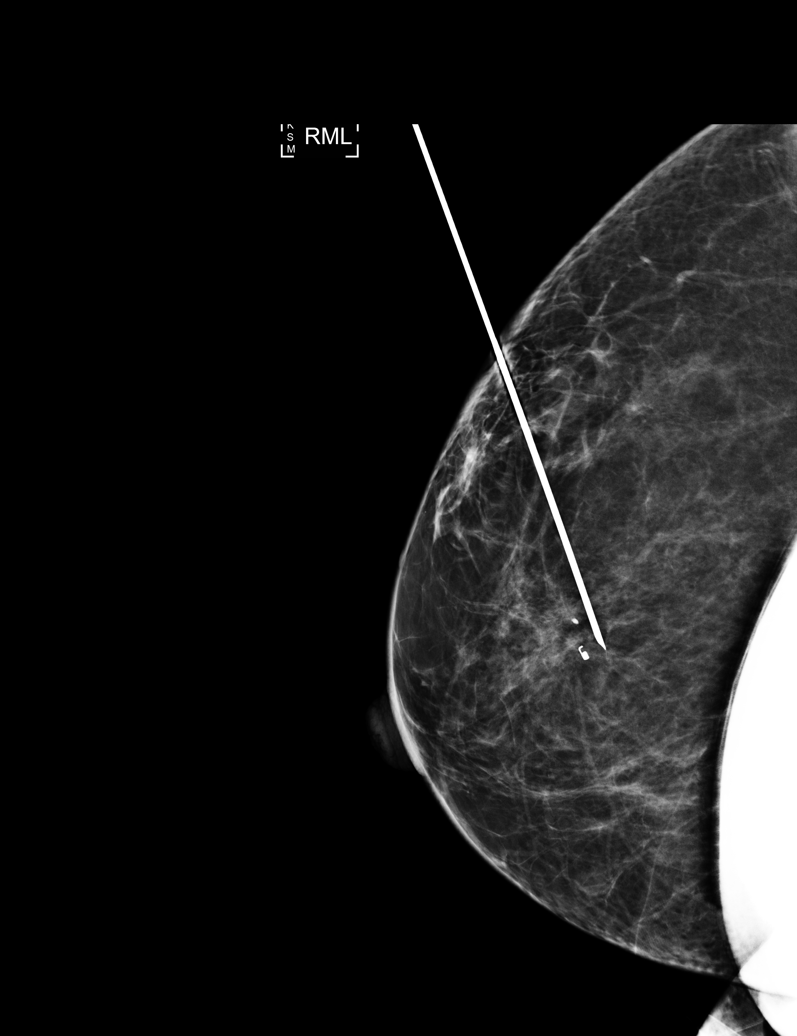

[R CC (1 of 3)]
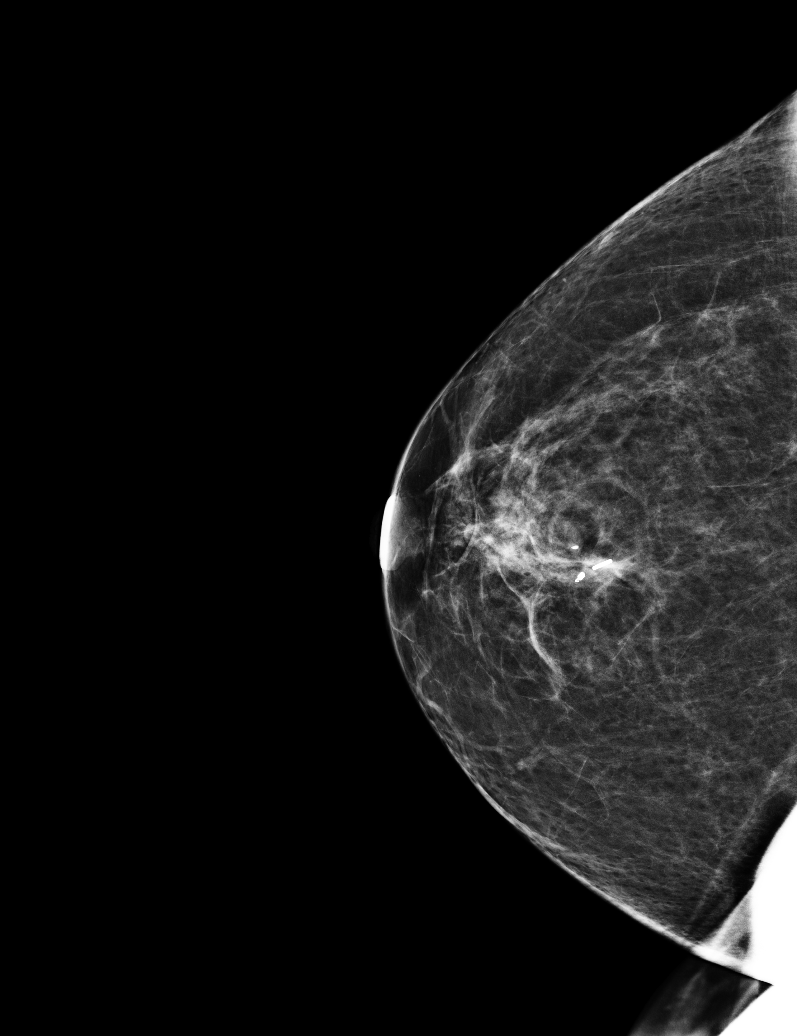

[R CC (2 of 3)]
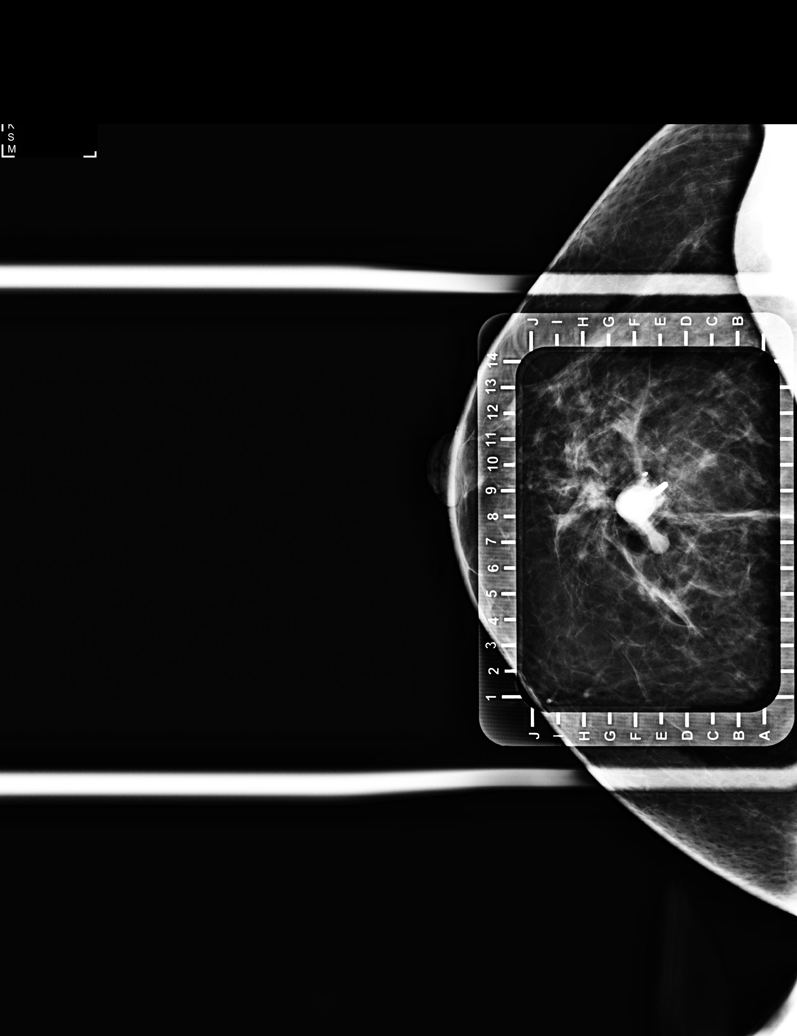

[R ML (3 of 3)]
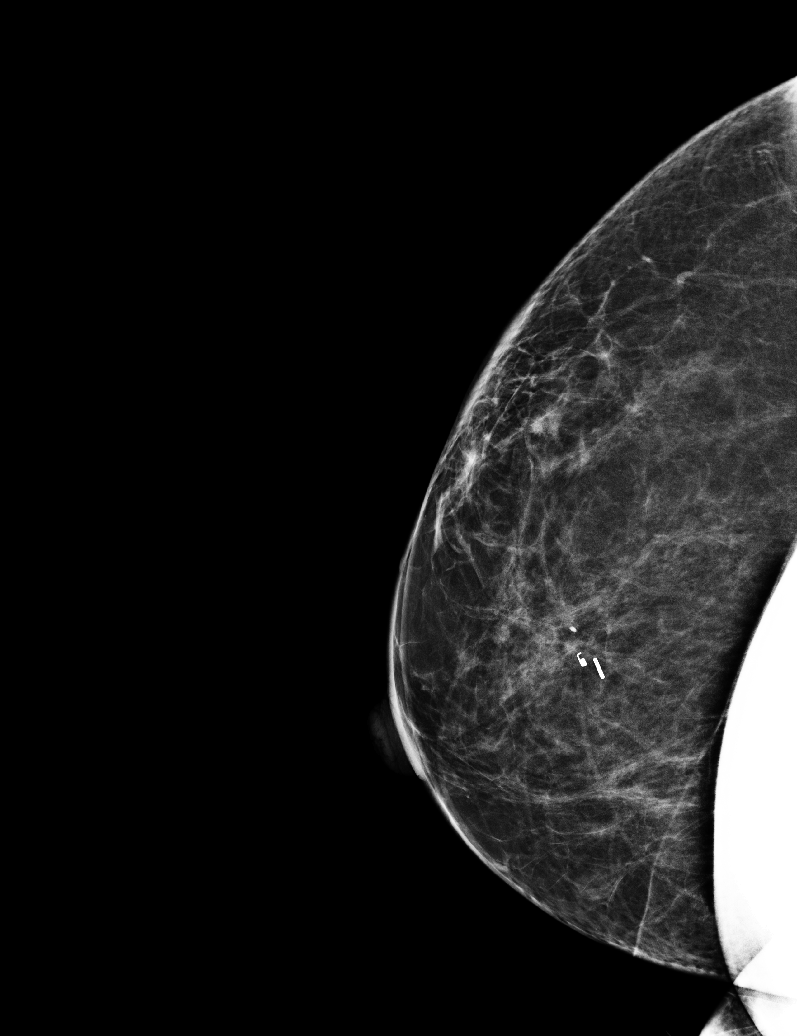

[R CC (3 of 3)]
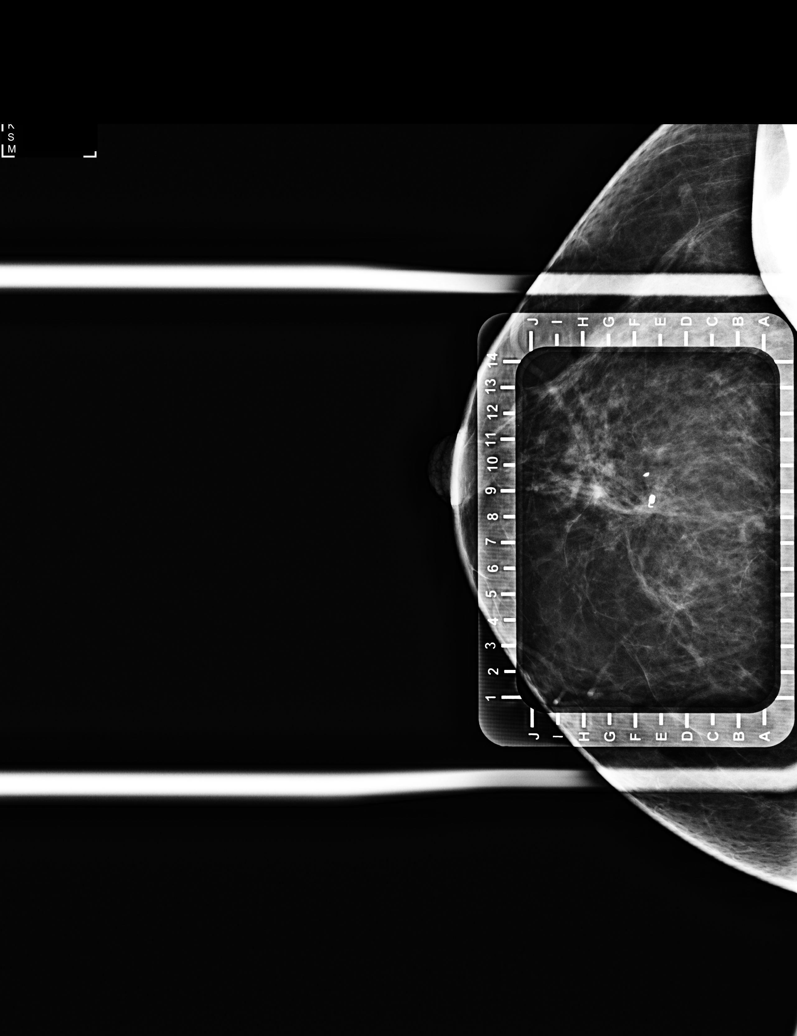

[6 of 6 positions shown; findings below may reference images not displayed]



The usual time-out protocol was performed immediately prior to the
procedure.

Using mammographic guidance, sterile technique, 1% lidocaine and an
D-XU0 radioactive seed, the biopsy clip was localized using a
superior approach. The follow-up mammogram images confirm the seed
in the expected location and were marked for the surgeon.

Follow-up survey of the patient confirms presence of the radioactive
seed.

Order number of D-XU0 seed:  [id number].

Total activity:  0.243 millicurie reference Date: September 14, 2018

The patient tolerated the procedure well and was released from the
[REDACTED]. She was given instructions regarding seed removal.
IMPRESSION: Radioactive seed localization right breast. No apparent
complications.

## 2020-03-18 IMAGING — DX BREAST SURGICAL SPECIMEN
1 series · 2 of 2 positions shown · non-contrast
Comparison: Previous exam(s).

CLINICAL DATA: Status post seed localized lumpectomy of the RIGHT
breast.

EXAM:
SPECIMEN RADIOGRAPH OF THE RIGHT BREAST

[Series 2: specimen digital x-ray, derived · right · 2 of 2 slices shown]
[im 1/2]
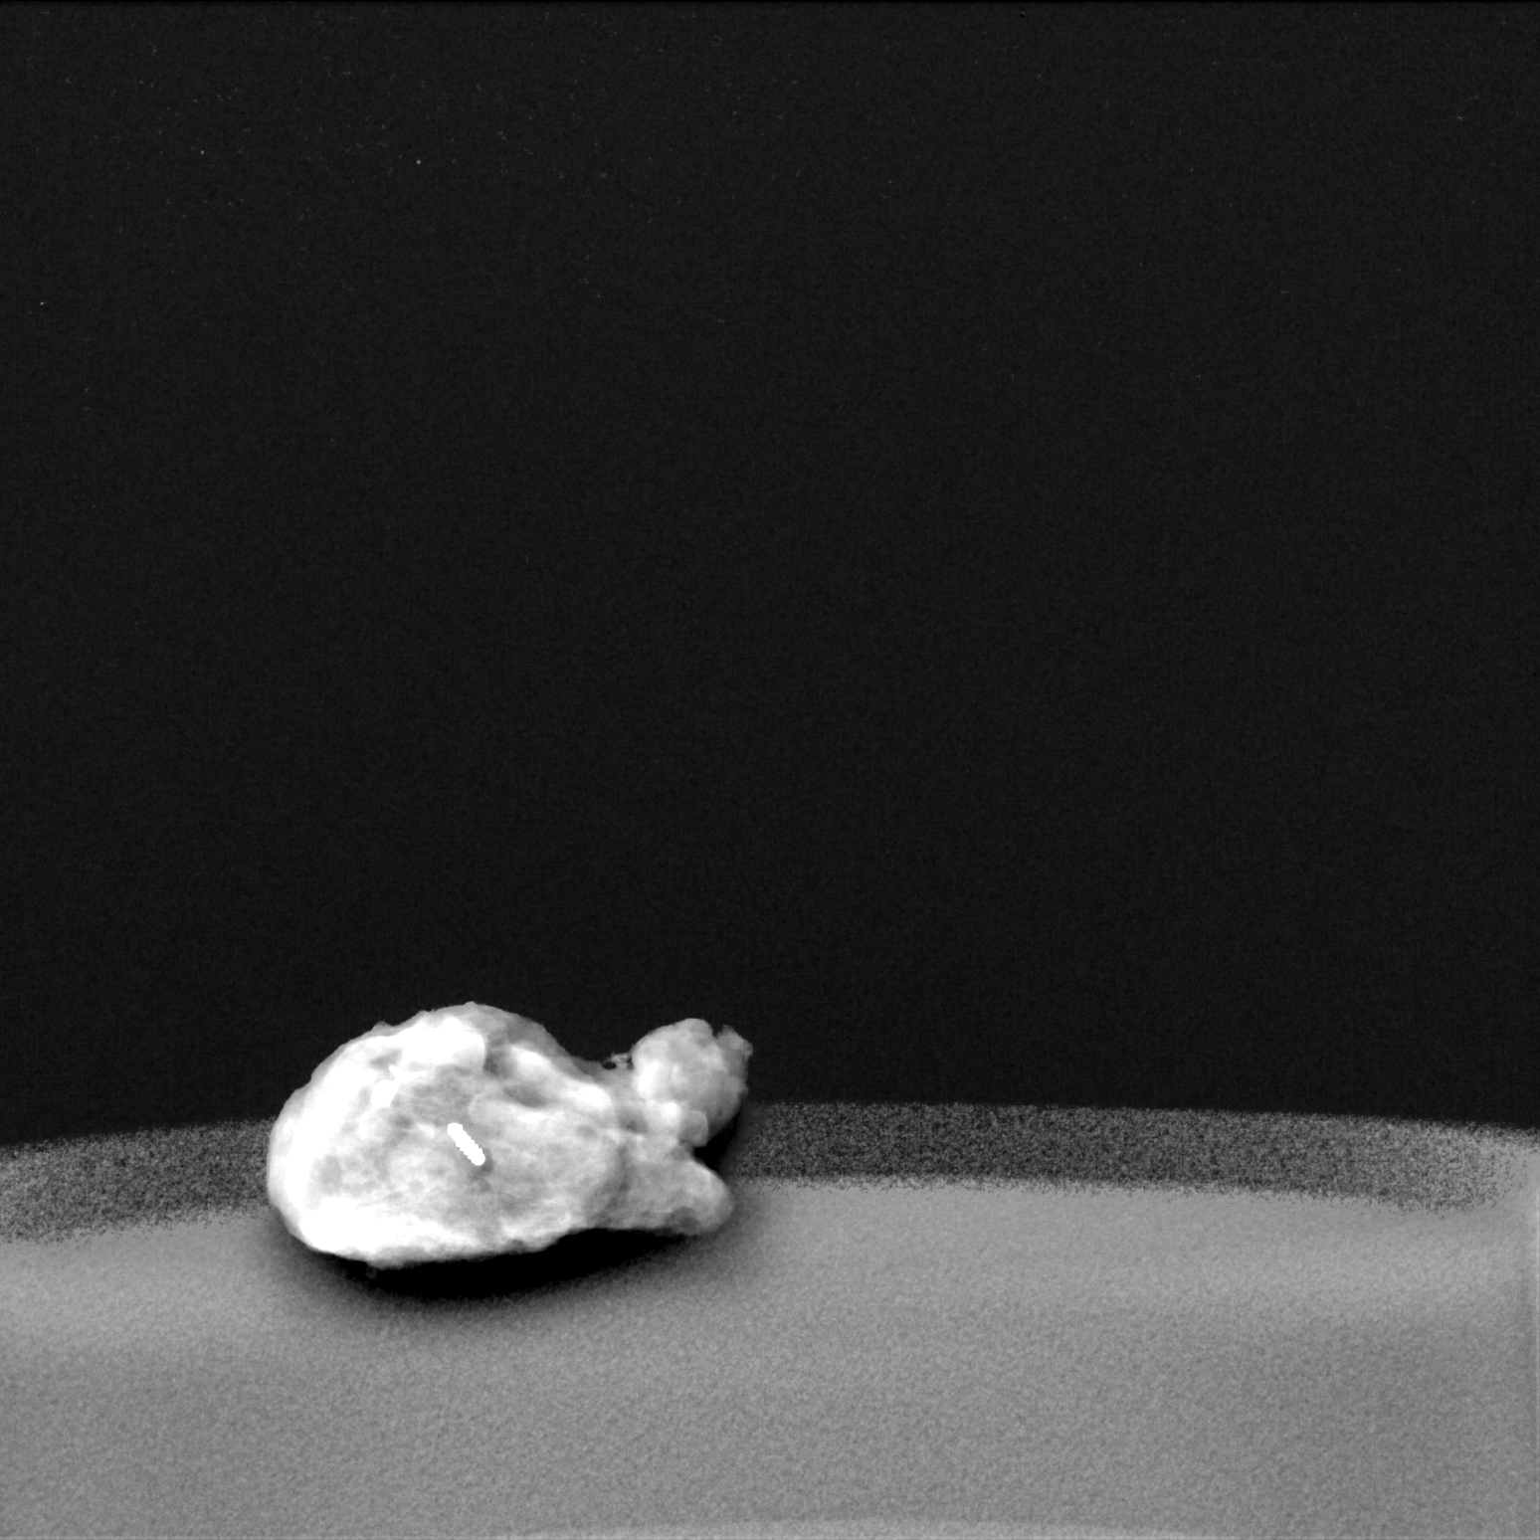
[im 2/2]
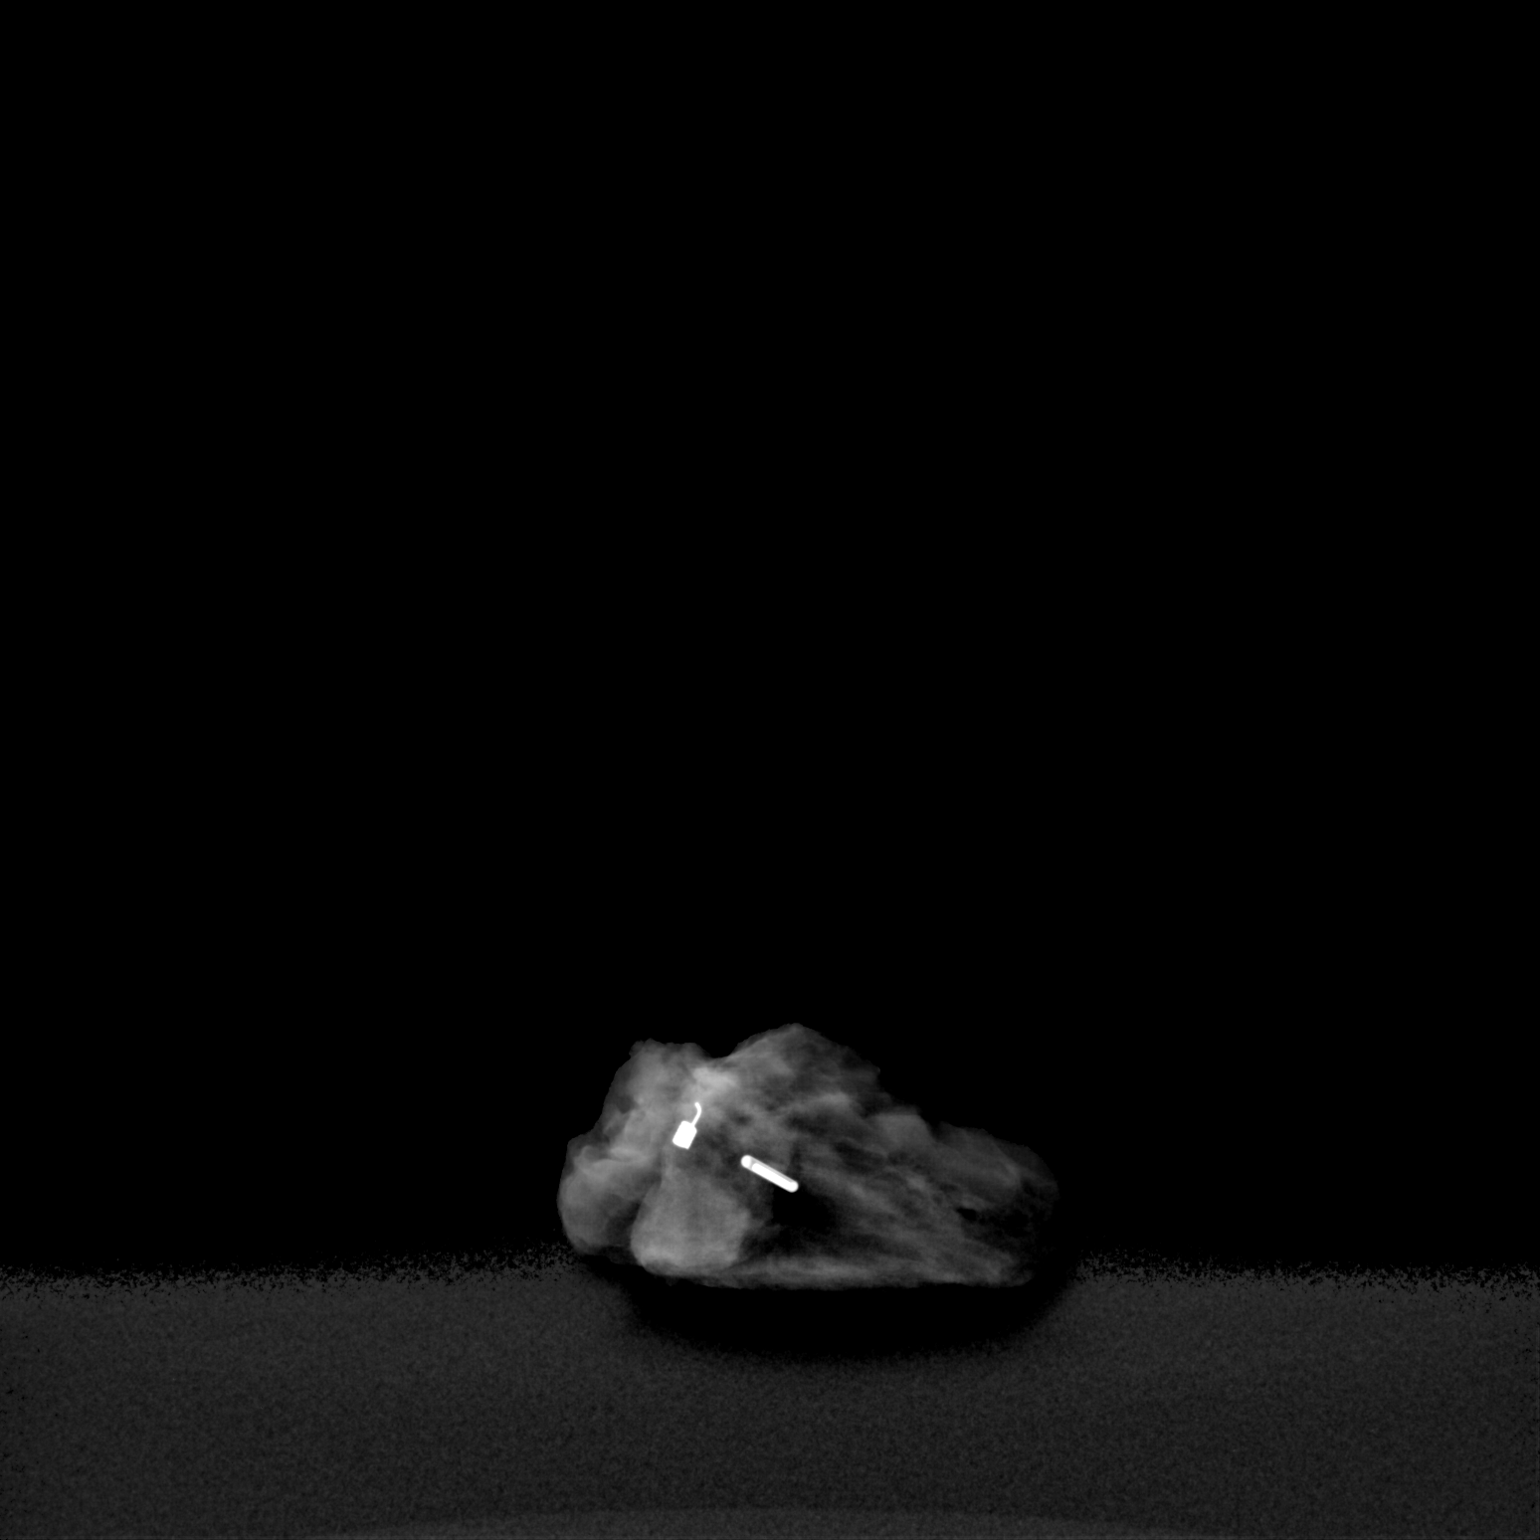

[2 of 2 positions shown; findings below may reference images not displayed]

FINDINGS: Status post excision of the right breast. The radioactive seed and
biopsy marker clip are present, completely intact, and were marked
for pathology.
IMPRESSION: Specimen radiograph of the right breast.

## 2020-12-26 ENCOUNTER — Ambulatory Visit (INDEPENDENT_AMBULATORY_CARE_PROVIDER_SITE_OTHER): Payer: 59 | Admitting: Family Medicine

## 2020-12-26 ENCOUNTER — Encounter: Payer: Self-pay | Admitting: Family Medicine

## 2020-12-26 ENCOUNTER — Other Ambulatory Visit: Payer: Self-pay

## 2020-12-26 ENCOUNTER — Other Ambulatory Visit (HOSPITAL_COMMUNITY)
Admission: RE | Admit: 2020-12-26 | Discharge: 2020-12-26 | Disposition: A | Discharge status: Home / Self Care | Payer: 59 | Source: Ambulatory Visit | Attending: Family Medicine | Admitting: Family Medicine

## 2020-12-26 VITALS — BP 111/57 | HR 83 | Ht 67.0 in | Wt 190.0 lb

## 2020-12-26 DIAGNOSIS — Z124 Encounter for screening for malignant neoplasm of cervix: Secondary | ICD-10-CM

## 2020-12-26 DIAGNOSIS — Z0000 Encounter for general adult medical examination without abnormal findings: Secondary | ICD-10-CM | POA: Diagnosis present

## 2020-12-26 DIAGNOSIS — Z1159 Encounter for screening for other viral diseases: Secondary | ICD-10-CM | POA: Diagnosis not present

## 2020-12-26 DIAGNOSIS — Z1231 Encounter for screening mammogram for malignant neoplasm of breast: Secondary | ICD-10-CM | POA: Diagnosis not present

## 2020-12-26 NOTE — Progress Notes (Addendum)
Subjective:     Cathy Price is a 55 y.o. female and is here for a comprehensive physical exam. The patient reports no problems.  She is doing well overall.  Not currently exercising.  Social History   Socioeconomic History   Marital status: Married    Spouse name: clifton    Number of children: 2   Years of education: 12   Highest education level: Not on file  Occupational History   Occupation: self employed    Comment: Marine scientist  Tobacco Use   Smoking status: Former    Types: Cigarettes    Quit date: 08/20/1987    Years since quitting: 33.3   Smokeless tobacco: Never  Vaping Use   Vaping Use: Never used  Substance and Sexual Activity   Alcohol use: Yes    Alcohol/week: 6.0 standard drinks    Types: 6 Standard drinks or equivalent per week    Comment: socially   Drug use: No   Sexual activity: Yes    Partners: Male    Birth control/protection: None  Other Topics Concern   Not on file  Social History Narrative   2 coffee drinks per day. Works out 6 days per week for about an hour doing cardio and weights. Currently using condoms for birth control.   Social Determinants of Health   Financial Resource Strain: Not on file  Food Insecurity: Not on file  Transportation Needs: Not on file  Physical Activity: Not on file  Stress: Not on file  Social Connections: Not on file  Intimate Partner Violence: Not on file   Health Maintenance  Topic Date Due   COVID-19 Vaccine (1) Never done   Hepatitis C Screening  Never done   Zoster Vaccines- Shingrix (1 of 2) Never done   PAP SMEAR-Modifier  03/20/2019   INFLUENZA VACCINE  09/08/2020   MAMMOGRAM  04/26/2021   TETANUS/TDAP  03/19/2024   COLONOSCOPY (Pts 45-80yrs Insurance coverage will need to be confirmed)  09/06/2028   HIV Screening  Completed   Pneumococcal Vaccine 65-33 Years old  Aged Out   HPV VACCINES  Aged Out    The following portions of the patient's history were reviewed and updated as appropriate:  allergies, current medications, past family history, past medical history, past social history, past surgical history, and problem list.  Review of Systems Pertinent items are noted in HPI.   Objective:    BP (!) 111/57   Pulse 83   Ht 5\' 7"  (1.702 m)   Wt 190 lb (86.2 kg)   SpO2 99%   BMI 29.76 kg/m  General appearance: alert, cooperative, and appears stated age Head: Normocephalic, without obvious abnormality, atraumatic Eyes:  conj clear, EOMI, PEERLA Ears: normal TM's and external ear canals both ears Nose: Nares normal. Septum midline. Mucosa normal. No drainage or sinus tenderness. Throat: lips, mucosa, and tongue normal; teeth and gums normal Neck: no adenopathy, no carotid bruit, no JVD, supple, symmetrical, trachea midline, and thyroid not enlarged, symmetric, no tenderness/mass/nodules Back: symmetric, no curvature. ROM normal. No CVA tenderness. Lungs: clear to auscultation bilaterally Breasts: normal appearance, no masses or tenderness Heart: regular rate and rhythm, S1, S2 normal, no murmur, click, rub or gallop Abdomen: soft, non-tender; bowel sounds normal; no masses,  no organomegaly Pelvic: cervix normal in appearance, external genitalia normal, no adnexal masses or tenderness, no cervical motion tenderness, rectovaginal septum normal, uterus normal size, shape, and consistency, and vagina normal without discharge Extremities: extremities normal, atraumatic, no cyanosis or  edema Pulses: 2+ and symmetric Skin: Skin color, texture, turgor normal. No rashes or lesions Lymph nodes: Cervical, supraclavicular, and axillary nodes normal. Neurologic: Grossly normal    Assessment:    Healthy female exam.      Plan:     See After Visit Summary for Counseling Recommendations  Keep up a regular exercise program and make sure you are eating a healthy diet Try to eat 4 servings of dairy a day, or if you are lactose intolerant take a calcium with vitamin D daily.  Your  vaccines are up to date.  Discussed need for shingles and flu vaccines.   Will call with Pap smear results. Plan to schedule her follow-up mammogram.  Orders Placed This Encounter  Procedures   MM 3D SCREEN BREAST BILATERAL    Standing Status:   Future    Standing Expiration Date:   12/26/2021    Scheduling Instructions:     Please call to schedule    Order Specific Question:   Reason for Exam (SYMPTOM  OR DIAGNOSIS REQUIRED)    Answer:   breast cancer screening    Order Specific Question:   Is the patient pregnant?    Answer:   No    Order Specific Question:   Preferred imaging location?    Answer:   [facility]   Lipid panel   COMPLETE METABOLIC PANEL WITH GFR   Hepatitis C antibody   CBC

## 2020-12-26 NOTE — Addendum Note (Signed)
Addended by: Nani Gasser D on: 12/26/2020 01:59 PM   Modules accepted: Orders

## 2020-12-30 ENCOUNTER — Ambulatory Visit
Admission: RE | Admit: 2020-12-30 | Discharge: 2020-12-30 | Disposition: A | Discharge status: Home / Self Care | Payer: 59 | Source: Ambulatory Visit | Attending: Family Medicine | Admitting: Family Medicine

## 2020-12-30 ENCOUNTER — Other Ambulatory Visit: Payer: Self-pay

## 2020-12-30 DIAGNOSIS — Z1231 Encounter for screening mammogram for malignant neoplasm of breast: Secondary | ICD-10-CM

## 2020-12-30 NOTE — Progress Notes (Signed)
Please call patient. Normal mammogram.  Repeat in 1 year.  

## 2021-01-05 LAB — CYTOLOGY - PAP
Comment: NEGATIVE
Diagnosis: UNDETERMINED — AB
High risk HPV: NEGATIVE

## 2021-01-06 NOTE — Progress Notes (Signed)
Hi Don, Pap smear shows atypical cells.  But you are negative for HPV which is great.  So the recommendation is to repeat the Pap smear and cotesting in 3 years.

## 2021-12-28 ENCOUNTER — Encounter: Payer: Self-pay | Admitting: Family Medicine

## 2021-12-28 ENCOUNTER — Ambulatory Visit (INDEPENDENT_AMBULATORY_CARE_PROVIDER_SITE_OTHER): Payer: No Typology Code available for payment source | Admitting: Family Medicine

## 2021-12-28 VITALS — BP 106/42 | HR 71 | Ht 67.0 in | Wt 190.0 lb

## 2021-12-28 DIAGNOSIS — Z1231 Encounter for screening mammogram for malignant neoplasm of breast: Secondary | ICD-10-CM

## 2021-12-28 DIAGNOSIS — Z0000 Encounter for general adult medical examination without abnormal findings: Secondary | ICD-10-CM

## 2021-12-28 DIAGNOSIS — Z23 Encounter for immunization: Secondary | ICD-10-CM | POA: Diagnosis not present

## 2021-12-28 LAB — CBC: WBC: 4.1 10*3/uL (ref 3.8–10.8)

## 2021-12-28 NOTE — Progress Notes (Signed)
Complete physical exam  Patient: Cathy Price   DOB: December 20, 1965   56 y.o. Female  MRN: [id number]  Subjective:    Chief Complaint  Patient presents with   Annual Exam    Cathy Price is a 56 y.o. female who presents today for a complete physical exam. She reports consuming a general diet.  She generally feels well.. She does not have additional problems to discuss today.    Most recent fall risk assessment:    12/28/2021   10:01 AM  Fall Risk   Falls in the past year? 0  Number falls in past yr: 0  Injury with Fall? 0  Risk for fall due to : No Fall Risks  Follow up Falls evaluation completed     Most recent depression screenings:    12/28/2021   10:01 AM 12/26/2020    1:31 PM  PHQ 2/9 Scores  PHQ - 2 Score 0 0        Patient Care Team: Agapito Games, MD as PCP - General (Family Medicine)   No outpatient medications prior to visit.   No facility-administered medications prior to visit.    ROS        Objective:     BP (!) 106/42   Pulse 71   Ht 5\' 7"  (1.702 m)   Wt 190 lb (86.2 kg)   SpO2 97%   BMI 29.76 kg/m    Physical Exam Vitals reviewed.  Constitutional:      Appearance: She is well-developed.  HENT:     Head: Normocephalic and atraumatic.     Right Ear: External ear normal.     Left Ear: External ear normal.     Nose: Nose normal.  Eyes:     Conjunctiva/sclera: Conjunctivae normal.     Pupils: Pupils are equal, round, and reactive to light.  Neck:     Thyroid: No thyromegaly.  Cardiovascular:     Rate and Rhythm: Normal rate and regular rhythm.     Heart sounds: Normal heart sounds.  Pulmonary:     Effort: Pulmonary effort is normal.     Breath sounds: Normal breath sounds. No wheezing.  Musculoskeletal:     Cervical back: Neck supple.  Lymphadenopathy:     Cervical: No cervical adenopathy.  Skin:    General: Skin is warm and dry.     Coloration: Skin is not pale.  Neurological:     Mental Status: She is  alert and oriented to person, place, and time.  Psychiatric:        Behavior: Behavior normal.      No results found for any visits on 12/28/21.     Assessment & Plan:    Routine Health Maintenance and Physical Exam  Immunization History  Administered Date(s) Administered   Influenza Inj Mdck Quad With Preservative 12/30/2017   Influenza,inj,Quad PF,6+ Mos 12/28/2021   Tdap 03/19/2014    Health Maintenance  Topic Date Due   Hepatitis C Screening  Never done   COVID-19 Vaccine (1) 01/14/2023 (Originally 11/26/1965)   Zoster Vaccines- Shingrix (1 of 2) 01/28/2023 (Originally 05/26/2017)   MAMMOGRAM  12/31/2022   PAP SMEAR-Modifier  12/26/2025   COLONOSCOPY (Pts 45-11yrs Insurance coverage will need to be confirmed)  09/06/2028   INFLUENZA VACCINE  Completed   HIV Screening  Completed   Pneumococcal Vaccine 54-34 Years old  Aged Out   HPV VACCINES  Aged Out    Discussed health benefits of physical activity, and  encouraged her to engage in regular exercise appropriate for her age and condition.  Problem List Items Addressed This Visit   None Visit Diagnoses     Wellness examination    -  Primary   Relevant Orders   Lipid Panel w/reflex Direct LDL   COMPLETE METABOLIC PANEL WITH GFR   CBC   Hepatitis C Antibody   Encounter for screening mammogram for malignant neoplasm of breast       Relevant Orders   MM 3D SCREEN BREAST BILATERAL   Need for immunization against influenza       Relevant Orders   Flu Vaccine QUAD 82mo+IM (Fluarix, Fluzone & Alfiuria Quad PF) (Completed)     Keep up a regular exercise program and make sure you are eating a healthy diet Try to eat 4 servings of dairy a day, or if you are lactose intolerant take a calcium with vitamin D daily.  Discussed need for shingles vaccine. Schedule her mammogram downstairs. Vaccine given today.  Return in about 1 year (around 12/29/2022) for Wellness Exam.     Nani Gasser, MD

## 2021-12-28 NOTE — Patient Instructions (Signed)
There is a product over-the-counter called Debrox which you can use to help soften the wax in your right ear.

## 2021-12-29 LAB — CBC
HCT: 43 % (ref 35.0–45.0)
Hemoglobin: 14.4 g/dL (ref 11.7–15.5)
MCH: 29.6 pg (ref 27.0–33.0)
MCHC: 33.5 g/dL (ref 32.0–36.0)
MCV: 88.5 fL (ref 80.0–100.0)
MPV: 9.9 fL (ref 7.5–12.5)
Platelets: 225 10*3/uL (ref 140–400)
RBC: 4.86 10*6/uL (ref 3.80–5.10)
RDW: 12.1 % (ref 11.0–15.0)

## 2021-12-29 LAB — COMPLETE METABOLIC PANEL WITH GFR
AG Ratio: 1.7 (calc) (ref 1.0–2.5)
ALT: 10 U/L (ref 6–29)
AST: 10 U/L (ref 10–35)
Albumin: 4.5 g/dL (ref 3.6–5.1)
Alkaline phosphatase (APISO): 58 U/L (ref 37–153)
BUN: 13 mg/dL (ref 7–25)
CO2: 29 mmol/L (ref 20–32)
Calcium: 9.6 mg/dL (ref 8.6–10.4)
Chloride: 105 mmol/L (ref 98–110)
Creat: 0.81 mg/dL (ref 0.50–1.03)
Globulin: 2.6 g/dL (calc) (ref 1.9–3.7)
Glucose, Bld: 96 mg/dL (ref 65–99)
Potassium: 4.3 mmol/L (ref 3.5–5.3)
Sodium: 141 mmol/L (ref 135–146)
Total Bilirubin: 0.6 mg/dL (ref 0.2–1.2)
Total Protein: 7.1 g/dL (ref 6.1–8.1)
eGFR: 86 mL/min/{1.73_m2} (ref >=60)

## 2021-12-29 LAB — LIPID PANEL W/REFLEX DIRECT LDL
Cholesterol: 202 mg/dL — ABNORMAL HIGH (ref <200)
HDL: 74 mg/dL (ref >=50)
LDL Cholesterol (Calc): 111 mg/dL (calc) — ABNORMAL HIGH
Non-HDL Cholesterol (Calc): 128 mg/dL (calc) (ref <130)
Total CHOL/HDL Ratio: 2.7 (calc) (ref <5.0)
Triglycerides: 76 mg/dL (ref <150)

## 2021-12-29 LAB — HEPATITIS C ANTIBODY: Hepatitis C Ab: NONREACTIVE

## 2021-12-30 NOTE — Progress Notes (Signed)
HI Earline,  Your LDL and total cholesterol is mildly elevated.  All other labs are normal.

## 2022-01-06 ENCOUNTER — Ambulatory Visit (INDEPENDENT_AMBULATORY_CARE_PROVIDER_SITE_OTHER): Payer: No Typology Code available for payment source

## 2022-01-06 DIAGNOSIS — Z1231 Encounter for screening mammogram for malignant neoplasm of breast: Secondary | ICD-10-CM

## 2022-01-07 NOTE — Progress Notes (Signed)
Please call patient. Normal mammogram.  Repeat in 1 year.  

## 2022-06-30 ENCOUNTER — Ambulatory Visit (INDEPENDENT_AMBULATORY_CARE_PROVIDER_SITE_OTHER): Payer: No Typology Code available for payment source | Admitting: Family Medicine

## 2022-06-30 ENCOUNTER — Encounter: Payer: Self-pay | Admitting: Family Medicine

## 2022-06-30 ENCOUNTER — Ambulatory Visit (INDEPENDENT_AMBULATORY_CARE_PROVIDER_SITE_OTHER): Payer: No Typology Code available for payment source

## 2022-06-30 VITALS — BP 100/49 | HR 77 | Ht 67.0 in | Wt 190.2 lb

## 2022-06-30 DIAGNOSIS — R1031 Right lower quadrant pain: Secondary | ICD-10-CM | POA: Diagnosis not present

## 2022-06-30 DIAGNOSIS — R109 Unspecified abdominal pain: Secondary | ICD-10-CM

## 2022-06-30 DIAGNOSIS — R1032 Left lower quadrant pain: Secondary | ICD-10-CM

## 2022-06-30 LAB — POCT URINALYSIS DIP (CLINITEK)
Bilirubin, UA: NEGATIVE
Blood, UA: NEGATIVE
Glucose, UA: NEGATIVE mg/dL
Ketones, POC UA: NEGATIVE mg/dL
Leukocytes, UA: NEGATIVE
Nitrite, UA: NEGATIVE
POC PROTEIN,UA: NEGATIVE
Spec Grav, UA: >=1.03 — AB (ref 1.010–1.025)
Urobilinogen, UA: 0.2 E.U./dL
pH, UA: 5.5 (ref 5.0–8.0)

## 2022-06-30 NOTE — Progress Notes (Signed)
Established patient visit   Patient: Cathy Price   DOB: 11-23-1965   57 y.o. Female  MRN: [id number] Visit Date: 06/30/2022  Today's healthcare provider: Charlton Amor, DO   Chief Complaint  Patient presents with   Flank Pain    Left sided flank/abdominal pain   - 3 weeks ago bent over and felt a "pop"- pain started as ache and how is shooting pain. Pain seems to be positional however walking seems to be pain free.     SUBJECTIVE    Chief Complaint  Patient presents with   Flank Pain    Left sided flank/abdominal pain   - 3 weeks ago bent over and felt a "pop"- pain started as ache and how is shooting pain. Pain seems to be positional however walking seems to be pain free.    Flank Pain Pertinent negatives include no abdominal pain, chest pain or fever.   Pt reports left flank pain that started 2-3 weeks ago. She said it happened when she was lifting two packs of coke at the grocery store. She stopped working out and rested and it seemed to improve however the other night the pain woke her from sleep and has been present since. She cannot say if something makes it better. She does not lifting her grandson and reaching for things make it worse.    Review of Systems  Constitutional:  Negative for activity change, fatigue and fever.  Respiratory:  Negative for cough and shortness of breath.   Cardiovascular:  Negative for chest pain.  Gastrointestinal:  Negative for abdominal pain.  Genitourinary:  Positive for flank pain. Negative for difficulty urinating.       No outpatient medications have been marked as taking for the 06/30/22 encounter (Office Visit) with Charlton Amor, DO.    OBJECTIVE    BP (!) 100/49   Pulse 77   Ht 5\' 7"  (1.702 m)   Wt 190 lb 4 oz (86.3 kg)   SpO2 100%   BMI 29.80 kg/m   Physical Exam Vitals and nursing note reviewed.  Constitutional:      General: She is not in acute distress.    Appearance: Normal appearance.  HENT:     Head:  Normocephalic and atraumatic.     Right Ear: External ear normal.     Left Ear: External ear normal.     Nose: Nose normal.  Eyes:     Conjunctiva/sclera: Conjunctivae normal.  Cardiovascular:     Rate and Rhythm: Normal rate and regular rhythm.  Pulmonary:     Effort: Pulmonary effort is normal.     Breath sounds: Normal breath sounds.  Abdominal:     General: Abdomen is flat. Bowel sounds are normal.     Palpations: Abdomen is soft.     Tenderness: There is abdominal tenderness (LUQ and LLQ). There is no right CVA tenderness or left CVA tenderness.  Neurological:     General: No focal deficit present.     Mental Status: She is alert and oriented to person, place, and time.  Psychiatric:        Mood and Affect: Mood normal.        Behavior: Behavior normal.        Thought Content: Thought content normal.        Judgment: Judgment normal.        ASSESSMENT & PLAN    Problem List Items Addressed This Visit  Other   Flank pain - Primary    - LUQ and LLQ pain. UA done to look for signs of urine infection that could be causing pain and was interpreted by myself as negative for infection. No leukocytes and no nitrites  - based on exam and history with exacerbation of picking up grandson or reaching for something I am wondering if this is msk related  - we will go ahead and get a KUB to evaluate for kidney stone. If negative will get a CT scan to evaluate for diverticulitis since her pain was on left abdominal upper and lower quadrant  - pt is not having any fevers or infectious symptoms so I don't think it is warranted to get blood work today - recommend advil and tylenol for pain control       Relevant Orders   POCT URINALYSIS DIP (CLINITEK) (Completed)   DG Abd 1 View (Completed)   CT Abdomen Pelvis W Contrast   Other Visit Diagnoses     Left lower quadrant abdominal pain       Relevant Orders   CT Abdomen Pelvis W Contrast       Return if symptoms worsen or  fail to improve.      No orders of the defined types were placed in this encounter.   Orders Placed This Encounter  Procedures   DG Abd 1 View    Standing Status:   Future    Number of Occurrences:   1    Standing Expiration Date:   06/30/2023    Order Specific Question:   Reason for Exam (SYMPTOM  OR DIAGNOSIS REQUIRED)    Answer:   flank pain concern for stone    Order Specific Question:   Is patient pregnant?    Answer:   No    Order Specific Question:   Preferred imaging location?    Answer:   [facility]   CT Abdomen Pelvis W Contrast    Standing Status:   Future    Standing Expiration Date:   06/30/2023    Order Specific Question:   If indicated for the ordered procedure, I authorize the administration of contrast media per Radiology protocol    Answer:   Yes    Order Specific Question:   Does the patient have a contrast media/X-ray dye allergy?    Answer:   No    Order Specific Question:   Is patient pregnant?    Answer:   No    Order Specific Question:   Preferred imaging location?    Answer:   [facility]    Order Specific Question:   If indicated for the ordered procedure, I authorize the administration of oral contrast media per Radiology protocol    Answer:   Yes   POCT URINALYSIS DIP (CLINITEK)     Charlton Amor, DO  [facility] [phone] (phone) [phone] (fax)  [facility]

## 2022-06-30 NOTE — Assessment & Plan Note (Addendum)
-   LUQ and LLQ pain. UA done to look for signs of urine infection that could be causing pain and was interpreted by myself as negative for infection. No leukocytes and no nitrites  - based on exam and history with exacerbation of picking up grandson or reaching for something I am wondering if this is msk related  - we will go ahead and get a KUB to evaluate for kidney stone. If negative will get a CT scan to evaluate for diverticulitis since her pain was on left abdominal upper and lower quadrant  - pt is not having any fevers or infectious symptoms so I don't think it is warranted to get blood work today - recommend advil and tylenol for pain control

## 2022-07-07 ENCOUNTER — Ambulatory Visit (INDEPENDENT_AMBULATORY_CARE_PROVIDER_SITE_OTHER): Payer: No Typology Code available for payment source

## 2022-07-07 DIAGNOSIS — R1032 Left lower quadrant pain: Secondary | ICD-10-CM | POA: Diagnosis not present

## 2022-07-07 DIAGNOSIS — R109 Unspecified abdominal pain: Secondary | ICD-10-CM

## 2022-07-07 MED ORDER — IOHEXOL 300 MG/ML  SOLN
100.0000 mL | Freq: Once | INTRAMUSCULAR | Status: AC | PRN
  Administered 2022-07-07: 100 mL via INTRAVENOUS

## 2022-09-27 ENCOUNTER — Encounter: Payer: Self-pay | Admitting: Family Medicine

## 2022-09-27 ENCOUNTER — Ambulatory Visit (INDEPENDENT_AMBULATORY_CARE_PROVIDER_SITE_OTHER): Payer: No Typology Code available for payment source | Admitting: Family Medicine

## 2022-09-27 ENCOUNTER — Ambulatory Visit (INDEPENDENT_AMBULATORY_CARE_PROVIDER_SITE_OTHER): Payer: No Typology Code available for payment source

## 2022-09-27 VITALS — BP 105/67 | HR 75 | Temp 98.1°F | Ht 67.0 in | Wt 187.8 lb

## 2022-09-27 DIAGNOSIS — I499 Cardiac arrhythmia, unspecified: Secondary | ICD-10-CM | POA: Diagnosis not present

## 2022-09-27 DIAGNOSIS — J40 Bronchitis, not specified as acute or chronic: Secondary | ICD-10-CM | POA: Diagnosis not present

## 2022-09-27 DIAGNOSIS — R0602 Shortness of breath: Secondary | ICD-10-CM

## 2022-09-27 DIAGNOSIS — R051 Acute cough: Secondary | ICD-10-CM

## 2022-09-27 MED ORDER — BENZONATATE 200 MG PO CAPS: 200.0000 mg | ORAL_CAPSULE | Freq: Two times a day (BID) | ORAL | 0 refills | Status: DC | PRN

## 2022-09-27 MED ORDER — PREDNISONE 20 MG PO TABS: 20.0000 mg | ORAL_TABLET | Freq: Two times a day (BID) | ORAL | 0 refills | Status: DC

## 2022-09-27 NOTE — Progress Notes (Signed)
URIANA BUECHE - 57 y.o. female MRN [id number]  Date of birth: 05/25/1965  Subjective Chief Complaint  Patient presents with   Cough    Cough x 2 days . Producing very small amount of yellow tinted phelgm.  She denies fever. Patient does c/o  SOB with little exertion.    HPI: MUSLIMAH HASSEL is a 57 year old female here today with complaint of cough.  She has a cough for approximately 2 weeks.  Cough is fairly dry.  Occasional get a small amount of sputum.  She does feel some dyspnea with doing more activity.  She denies fever or chills.  No history of asthma.  Has not smoked in 30+ years.  She has tried several over-the-counter and homeopathic remedies without much improvement.  ROS:  A comprehensive ROS was completed and negative except as noted per HPI   Allergies  Allergen Reactions   Amoxicillin Rash    Past Medical History:  Diagnosis Date   Breast mass, right     Past Surgical History:  Procedure Laterality Date   BREAST LUMPECTOMY WITH RADIOACTIVE SEED LOCALIZATION Right 09/27/2018   Procedure: RIGHT BREAST LUMPECTOMY WITH RADIOACTIVE SEED LOCALIZATION;  Surgeon: Abigail Miyamoto, MD;  Location: [facility];  Service: General;  Laterality: Right;   DILATION AND CURETTAGE OF UTERUS      Social History   Socioeconomic History   Marital status: Married    Spouse name: clifton    Number of children: 2   Years of education: 12   Highest education level: Not on file  Occupational History   Occupation: self employed    Comment: Marine scientist  Tobacco Use   Smoking status: Former    Current packs/day: 0.00    Types: Cigarettes    Quit date: 08/20/1987    Years since quitting: 35.1   Smokeless tobacco: Never  Vaping Use   Vaping status: Never Used  Substance and Sexual Activity   Alcohol use: Yes    Alcohol/week: 6.0 standard drinks of alcohol    Types: 6 Standard drinks or equivalent per week    Comment: socially   Drug use: No   Sexual  activity: Yes    Partners: Male    Birth control/protection: None  Other Topics Concern   Not on file  Social History Narrative   2 coffee drinks per day. Works out 6 days per week for about an hour doing cardio and weights. Currently using condoms for birth control.   Social Determinants of Health   Financial Resource Strain: Not on file  Food Insecurity: Not on file  Transportation Needs: Not on file  Physical Activity: Not on file  Stress: Not on file  Social Connections: Not on file    Family History  Problem Relation Age of Onset   Cancer - Lung Paternal Grandfather        deceased   Cancer - Lung Paternal Grandmother        deceased   Diabetes Paternal Aunt        deceased   Heart attack Maternal Grandfather        deceased   Colon polyps Father    Colon cancer Neg Hx    Esophageal cancer Neg Hx    Stomach cancer Neg Hx    Rectal cancer Neg Hx     Health Maintenance  Topic Date Due   INFLUENZA VACCINE  09/09/2022   COVID-19 Vaccine (1 - 2023-24 season) 01/14/2023 (Originally 10/09/2021)   Zoster  Vaccines- Shingrix (1 of 2) 01/28/2023 (Originally 05/26/2017)   MAMMOGRAM  01/07/2024   DTaP/Tdap/Td (2 - Td or Tdap) 03/19/2024   PAP SMEAR-Modifier  12/26/2025   Colonoscopy  09/06/2028   Hepatitis C Screening  Completed   HIV Screening  Completed   Pneumococcal Vaccine 56-53 Years old  Aged Out   HPV VACCINES  Aged Out     ----------------------------------------------------------------------------------------------------------------------------------------------------------------------------------------------------------------- Physical Exam BP 105/67   Pulse 75   Temp 98.1 F (36.7 C)   Ht 5\' 7"  (1.702 m)   Wt 187 lb 12 oz (85.2 kg)   SpO2 96%   BMI 29.41 kg/m   Physical Exam Constitutional:      Appearance: Normal appearance.  HENT:     Head: Normocephalic and atraumatic.  Eyes:     General: No scleral icterus. Cardiovascular:     Rate and  Rhythm: Normal rate. Rhythm irregular.  Pulmonary:     Effort: Pulmonary effort is normal.     Breath sounds: Normal breath sounds.  Musculoskeletal:     Cervical back: Neck supple.  Neurological:     General: No focal deficit present.     Mental Status: She is alert.  Psychiatric:        Mood and Affect: Mood normal.        Behavior: Behavior normal.    EKG: Normal sinus rhythm with premature supraventricular complexes. ------------------------------------------------------------------------------------------------------------------------------------------------------------------------------------------------------------------- Assessment and Plan  Bronchitis chest x-ray ordered.  Adding burst of prednisone.  Tessalon Perles as needed for cough.  Recommend over-the-counter Delsym as needed.  Cardiac arrhythmia She had irregular heartbeat on exam.  EKG did show occasional premature supraventricular complexes.  No signs of A-fib.   Meds ordered this encounter  Medications   predniSONE (DELTASONE) 20 MG tablet    Sig: Take 1 tablet (20 mg total) by mouth 2 (two) times daily with a meal for 5 days.    Dispense:  10 tablet    Refill:  0   benzonatate (TESSALON) 200 MG capsule    Sig: Take 1 capsule (200 mg total) by mouth 2 (two) times daily as needed for cough.    Dispense:  30 capsule    Refill:  0    No follow-ups on file.    This visit occurred during the SARS-CoV-2 public health emergency.  Safety protocols were in place, including screening questions prior to the visit, additional usage of staff PPE, and extensive cleaning of exam room while observing appropriate contact time as indicated for disinfecting solutions.

## 2022-09-27 NOTE — Patient Instructions (Signed)
Have xray completed.  Start prednisone.  Tessalon perles as needed.  Try Delsym (over the counter cough syrup).

## 2022-09-27 NOTE — Assessment & Plan Note (Signed)
chest x-ray ordered.  Adding burst of prednisone.  Tessalon Perles as needed for cough.  Recommend over-the-counter Delsym as needed.

## 2022-09-27 NOTE — Assessment & Plan Note (Signed)
She had irregular heartbeat on exam.  EKG did show occasional premature supraventricular complexes.  No signs of A-fib.

## 2022-09-29 ENCOUNTER — Encounter: Payer: Self-pay | Admitting: Family Medicine

## 2022-09-30 ENCOUNTER — Telehealth: Payer: Self-pay | Admitting: Family Medicine

## 2022-09-30 MED ORDER — METHYLPREDNISOLONE 4 MG PO TBPK: ORAL_TABLET | ORAL | 0 refills | Status: DC

## 2022-09-30 NOTE — Telephone Encounter (Signed)
We can try change to medrol.  Updated rx sent in. Additionally, she can try adding cetirizine for allergies.   CM

## 2022-09-30 NOTE — Telephone Encounter (Signed)
Patient informed. 

## 2022-09-30 NOTE — Telephone Encounter (Signed)
Patient called and stated that she is allergic to prednisone she said her eyes almost were swollen shut and sneezing she is asking for something else please advise  Pharmacy [facility] Phone number is [phone]

## 2022-10-05 ENCOUNTER — Other Ambulatory Visit: Payer: Self-pay | Admitting: Family Medicine

## 2022-10-05 MED ORDER — DOXYCYCLINE HYCLATE 100 MG PO TABS: 100.0000 mg | ORAL_TABLET | Freq: Two times a day (BID) | ORAL | 0 refills | Status: DC

## 2023-01-03 ENCOUNTER — Encounter: Payer: Self-pay | Admitting: Family Medicine

## 2023-02-16 ENCOUNTER — Encounter: Payer: Self-pay | Admitting: Family Medicine

## 2023-02-16 ENCOUNTER — Ambulatory Visit (INDEPENDENT_AMBULATORY_CARE_PROVIDER_SITE_OTHER): Payer: No Typology Code available for payment source | Admitting: Family Medicine

## 2023-02-16 VITALS — BP 111/70 | HR 73 | Ht 67.0 in | Wt 192.0 lb

## 2023-02-16 DIAGNOSIS — Z23 Encounter for immunization: Secondary | ICD-10-CM

## 2023-02-16 DIAGNOSIS — Z0000 Encounter for general adult medical examination without abnormal findings: Secondary | ICD-10-CM | POA: Diagnosis not present

## 2023-02-16 NOTE — Progress Notes (Signed)
 Complete physical exam  Patient: Cathy Price   DOB: 1965-02-14   58 y.o. Female  MRN: [id number]  Subjective:    Chief Complaint  Patient presents with   Annual Exam    Cathy Price is a 58 y.o. female who presents today for a complete physical exam. She reports consuming a general diet.  Working out and going to the gym/walking 5 days/week she has a group of friends that she does this with.  She generally feels well. She reports sleeping fairly well. She does not have additional problems to discuss today.    Most recent fall risk assessment:    02/16/2023    9:04 AM  Fall Risk   Falls in the past year? 0  Number falls in past yr: 0  Injury with Fall? 0  Risk for fall due to : No Fall Risks  Follow up Falls evaluation completed     Most recent depression screenings:    02/16/2023    9:04 AM 06/30/2022   11:39 AM  PHQ 2/9 Scores  PHQ - 2 Score 0 0        Patient Care Team: Alvan Dorothyann BIRCH, MD as PCP - General (Family Medicine)   Outpatient Medications Prior to Visit  Medication Sig   [DISCONTINUED] benzonatate  (TESSALON ) 200 MG capsule Take 1 capsule (200 mg total) by mouth 2 (two) times daily as needed for cough.   [DISCONTINUED] doxycycline  (VIBRA -TABS) 100 MG tablet Take 1 tablet (100 mg total) by mouth 2 (two) times daily.   [DISCONTINUED] methylPREDNISolone  (MEDROL  DOSEPAK) 4 MG TBPK tablet Taper as directed on packaging.   No facility-administered medications prior to visit.    ROS        Objective:     BP 111/70   Pulse 73   Ht 5' 7 (1.702 m)   Wt 192 lb (87.1 kg)   SpO2 100%   BMI 30.07 kg/m    Physical Exam Constitutional:      Appearance: Normal appearance.  HENT:     Head: Normocephalic and atraumatic.     Right Ear: Tympanic membrane, ear canal and external ear normal.     Left Ear: Tympanic membrane, ear canal and external ear normal.     Nose: Nose normal.     Mouth/Throat:     Pharynx: Oropharynx is clear.  Eyes:      Extraocular Movements: Extraocular movements intact.     Conjunctiva/sclera: Conjunctivae normal.     Pupils: Pupils are equal, round, and reactive to light.  Neck:     Thyroid: No thyromegaly.  Cardiovascular:     Rate and Rhythm: Normal rate and regular rhythm.  Pulmonary:     Effort: Pulmonary effort is normal.     Breath sounds: Normal breath sounds.  Abdominal:     General: Bowel sounds are normal.     Palpations: Abdomen is soft.     Tenderness: There is no abdominal tenderness.  Musculoskeletal:        General: No swelling.     Cervical back: Neck supple.  Skin:    General: Skin is warm and dry.  Neurological:     Mental Status: She is oriented to person, place, and time.  Psychiatric:        Mood and Affect: Mood normal.        Behavior: Behavior normal.      No results found for any visits on 02/16/23.     Assessment & Plan:  Routine Health Maintenance and Physical Exam  Immunization History  Administered Date(s) Administered   Influenza Inj Mdck Quad With Preservative 12/30/2017   Influenza, Seasonal, Injecte, Preservative Fre 02/16/2023   Influenza,inj,Quad PF,6+ Mos 12/28/2021   Tdap 03/19/2014    Health Maintenance  Topic Date Due   COVID-19 Vaccine (1 - 2024-25 season) Never done   Zoster Vaccines- Shingrix (1 of 2) 01/16/2024 (Originally 05/26/2017)   MAMMOGRAM  01/07/2024   DTaP/Tdap/Td (2 - Td or Tdap) 03/19/2024   Cervical Cancer Screening (HPV/Pap Cotest)  12/26/2025   Colonoscopy  09/06/2028   INFLUENZA VACCINE  Completed   Hepatitis C Screening  Completed   HIV Screening  Completed   HPV VACCINES  Aged Out    Discussed health benefits of physical activity, and encouraged her to engage in regular exercise appropriate for her age and condition.  Problem List Items Addressed This Visit   None Visit Diagnoses       Wellness examination    -  Primary   Relevant Orders   CMP14+EGFR   Lipid panel   CBC     Encounter for  immunization       Relevant Orders   Flu vaccine trivalent PF, 6mos and older(Flulaval,Afluria,Fluarix,Fluzone) (Completed)       Keep up a regular exercise program and make sure you are eating a healthy diet Try to eat 4 servings of dairy a day, or if you are lactose intolerant take a calcium with vitamin D daily.  Your vaccines are up to date.   No follow-ups on file.     Dorothyann Byars, MD

## 2023-02-17 LAB — CMP14+EGFR
ALT: 12 [IU]/L (ref 0–32)
AST: 14 [IU]/L (ref 0–40)
Albumin: 4.4 g/dL (ref 3.8–4.9)
Alkaline Phosphatase: 63 [IU]/L (ref 44–121)
BUN/Creatinine Ratio: 18 (ref 9–23)
BUN: 15 mg/dL (ref 6–24)
Bilirubin Total: 0.5 mg/dL (ref 0.0–1.2)
CO2: 23 mmol/L (ref 20–29)
Calcium: 9.6 mg/dL (ref 8.7–10.2)
Chloride: 103 mmol/L (ref 96–106)
Creatinine, Ser: 0.83 mg/dL (ref 0.57–1.00)
Globulin, Total: 2.5 g/dL (ref 1.5–4.5)
Glucose: 97 mg/dL (ref 70–99)
Potassium: 4.4 mmol/L (ref 3.5–5.2)
Sodium: 142 mmol/L (ref 134–144)
Total Protein: 6.9 g/dL (ref 6.0–8.5)
eGFR: 83 mL/min/{1.73_m2} (ref >59)

## 2023-02-17 LAB — CBC
Hematocrit: 46.5 % (ref 34.0–46.6)
Hemoglobin: 15.4 g/dL (ref 11.1–15.9)
MCH: 29.4 pg (ref 26.6–33.0)
MCHC: 33.1 g/dL (ref 31.5–35.7)
MCV: 89 fL (ref 79–97)
Platelets: 240 10*3/uL (ref 150–450)
RBC: 5.24 x10E6/uL (ref 3.77–5.28)
RDW: 11.8 % (ref 11.7–15.4)
WBC: 3.8 10*3/uL (ref 3.4–10.8)

## 2023-02-17 LAB — LIPID PANEL
Chol/HDL Ratio: 2.9 {ratio} (ref 0.0–4.4)
Cholesterol, Total: 214 mg/dL — ABNORMAL HIGH (ref 100–199)
HDL: 74 mg/dL (ref >39)
LDL Chol Calc (NIH): 129 mg/dL — ABNORMAL HIGH (ref 0–99)
Triglycerides: 62 mg/dL (ref 0–149)
VLDL Cholesterol Cal: 11 mg/dL (ref 5–40)

## 2023-02-17 NOTE — Progress Notes (Signed)
 Hi Lorean, it was good to see you yesterday.  Total cholesterol and LDL are mildly elevated.  Not in a range that we would consider medication but just encourage you to continue to work on healthy diet and know you are working out regularly which is great.  Your metabolic panel is normal and your blood count is normal no sign of anemia.

## 2023-07-11 ENCOUNTER — Other Ambulatory Visit (HOSPITAL_BASED_OUTPATIENT_CLINIC_OR_DEPARTMENT_OTHER): Payer: Self-pay | Admitting: Family Medicine

## 2023-07-11 DIAGNOSIS — Z1231 Encounter for screening mammogram for malignant neoplasm of breast: Secondary | ICD-10-CM

## 2023-07-13 ENCOUNTER — Ambulatory Visit

## 2023-07-13 DIAGNOSIS — Z1231 Encounter for screening mammogram for malignant neoplasm of breast: Secondary | ICD-10-CM

## 2023-07-19 ENCOUNTER — Ambulatory Visit: Payer: Self-pay | Admitting: Family Medicine

## 2023-07-19 NOTE — Progress Notes (Signed)
 Please call patient. Normal mammogram.  Repeat in 1 year.
